# Patient Record
Sex: Female | Born: 1960 | Race: White | Hispanic: Yes | Smoking: Current every day smoker
Health system: Southern US, Community
[De-identification: ages and names within clinical notes are randomized; demographics above are authoritative.]

## PROBLEM LIST (undated history)

## (undated) DIAGNOSIS — I10 Essential (primary) hypertension: Secondary | ICD-10-CM

## (undated) DIAGNOSIS — E119 Type 2 diabetes mellitus without complications: Secondary | ICD-10-CM

## (undated) DIAGNOSIS — R221 Localized swelling, mass and lump, neck: Secondary | ICD-10-CM

---

## 2016-01-20 DIAGNOSIS — R221 Localized swelling, mass and lump, neck: Secondary | ICD-10-CM

## 2016-01-20 HISTORY — DX: Localized swelling, mass and lump, neck: R22.1

## 2016-02-10 ENCOUNTER — Emergency Department (HOSPITAL_COMMUNITY): Payer: Self-pay

## 2016-02-10 ENCOUNTER — Inpatient Hospital Stay (HOSPITAL_COMMUNITY)
Admission: EM | Admit: 2016-02-10 | Discharge: 2016-02-16 | DRG: 158 | Disposition: A | Discharge status: Home / Self Care | Payer: Self-pay | Attending: Internal Medicine | Admitting: Internal Medicine

## 2016-02-10 ENCOUNTER — Encounter (HOSPITAL_COMMUNITY): Payer: Self-pay

## 2016-02-10 DIAGNOSIS — Z6831 Body mass index (BMI) 31.0-31.9, adult: Secondary | ICD-10-CM

## 2016-02-10 DIAGNOSIS — F17210 Nicotine dependence, cigarettes, uncomplicated: Secondary | ICD-10-CM | POA: Diagnosis present

## 2016-02-10 DIAGNOSIS — K122 Cellulitis and abscess of mouth: Principal | ICD-10-CM | POA: Diagnosis present

## 2016-02-10 DIAGNOSIS — R221 Localized swelling, mass and lump, neck: Secondary | ICD-10-CM | POA: Diagnosis present

## 2016-02-10 DIAGNOSIS — R938 Abnormal findings on diagnostic imaging of other specified body structures: Secondary | ICD-10-CM

## 2016-02-10 DIAGNOSIS — R9389 Abnormal findings on diagnostic imaging of other specified body structures: Secondary | ICD-10-CM

## 2016-02-10 DIAGNOSIS — E669 Obesity, unspecified: Secondary | ICD-10-CM | POA: Diagnosis present

## 2016-02-10 DIAGNOSIS — E119 Type 2 diabetes mellitus without complications: Secondary | ICD-10-CM

## 2016-02-10 DIAGNOSIS — N179 Acute kidney failure, unspecified: Secondary | ICD-10-CM | POA: Diagnosis present

## 2016-02-10 DIAGNOSIS — I76 Septic arterial embolism: Secondary | ICD-10-CM | POA: Diagnosis present

## 2016-02-10 DIAGNOSIS — I10 Essential (primary) hypertension: Secondary | ICD-10-CM | POA: Diagnosis present

## 2016-02-10 DIAGNOSIS — D72829 Elevated white blood cell count, unspecified: Secondary | ICD-10-CM

## 2016-02-10 DIAGNOSIS — E1165 Type 2 diabetes mellitus with hyperglycemia: Secondary | ICD-10-CM | POA: Diagnosis present

## 2016-02-10 HISTORY — DX: Type 2 diabetes mellitus without complications: E11.9

## 2016-02-10 HISTORY — DX: Localized swelling, mass and lump, neck: R22.1

## 2016-02-10 HISTORY — DX: Essential (primary) hypertension: I10

## 2016-02-10 LAB — COMPREHENSIVE METABOLIC PANEL
ALBUMIN: 3.2 g/dL — AB (ref 3.5–5.0)
ALT: 22 U/L (ref 14–54)
AST: 25 U/L (ref 15–41)
Alkaline Phosphatase: 123 U/L (ref 38–126)
Anion gap: 11 (ref 5–15)
BILIRUBIN TOTAL: 0.7 mg/dL (ref 0.3–1.2)
BUN: 9 mg/dL (ref 6–20)
CHLORIDE: 99 mmol/L — AB (ref 101–111)
CO2: 25 mmol/L (ref 22–32)
Calcium: 9.9 mg/dL (ref 8.9–10.3)
Creatinine, Ser: 0.8 mg/dL (ref 0.44–1.00)
GFR calc Af Amer: >60 mL/min (ref >60)
GFR calc non Af Amer: >60 mL/min (ref >60)
GLUCOSE: 314 mg/dL — AB (ref 65–99)
Potassium: 3.7 mmol/L (ref 3.5–5.1)
Sodium: 135 mmol/L (ref 135–145)
Total Protein: 7.7 g/dL (ref 6.5–8.1)

## 2016-02-10 LAB — CBC WITH DIFFERENTIAL/PLATELET
Basophils Absolute: 0 10*3/uL (ref 0.0–0.1)
Basophils Relative: 0 %
EOS PCT: 2 %
Eosinophils Absolute: 0.3 10*3/uL (ref 0.0–0.7)
HCT: 43.5 % (ref 36.0–46.0)
Hemoglobin: 14.8 g/dL (ref 12.0–15.0)
LYMPHS ABS: 2.5 10*3/uL (ref 0.7–4.0)
LYMPHS PCT: 18 %
MCH: 29.5 pg (ref 26.0–34.0)
MCHC: 34 g/dL (ref 30.0–36.0)
MCV: 86.7 fL (ref 78.0–100.0)
MONO ABS: 0.7 10*3/uL (ref 0.1–1.0)
MONOS PCT: 5 %
Neutro Abs: 10.2 10*3/uL — ABNORMAL HIGH (ref 1.7–7.7)
Neutrophils Relative %: 75 %
PLATELETS: 287 10*3/uL (ref 150–400)
RBC: 5.02 MIL/uL (ref 3.87–5.11)
RDW: 12.5 % (ref 11.5–15.5)
WBC: 13.7 10*3/uL — ABNORMAL HIGH (ref 4.0–10.5)

## 2016-02-10 LAB — CBG MONITORING, ED: Glucose-Capillary: 263 mg/dL — ABNORMAL HIGH (ref 65–99)

## 2016-02-10 LAB — I-STAT CG4 LACTIC ACID, ED: LACTIC ACID, VENOUS: 1.39 mmol/L (ref 0.5–1.9)

## 2016-02-10 MED ORDER — ACETAMINOPHEN 325 MG PO TABS
650.0000 mg | ORAL_TABLET | Freq: Four times a day (QID) | ORAL | Status: DC | PRN
  Administered 2016-02-11: 650 mg via ORAL
  Filled 2016-02-10: qty 2

## 2016-02-10 MED ORDER — ONDANSETRON HCL 4 MG/2ML IJ SOLN: 4.0000 mg | Freq: Four times a day (QID) | INTRAMUSCULAR | Status: DC | PRN

## 2016-02-10 MED ORDER — INSULIN ASPART 100 UNIT/ML ~~LOC~~ SOLN
0.0000 [IU] | Freq: Three times a day (TID) | SUBCUTANEOUS | Status: DC
  Administered 2016-02-12 (×2): 4 [IU] via SUBCUTANEOUS
  Administered 2016-02-13: 7 [IU] via SUBCUTANEOUS
  Administered 2016-02-13 – 2016-02-14 (×3): 4 [IU] via SUBCUTANEOUS
  Administered 2016-02-14: 3 [IU] via SUBCUTANEOUS
  Administered 2016-02-14 – 2016-02-15 (×2): 4 [IU] via SUBCUTANEOUS
  Administered 2016-02-15: 7 [IU] via SUBCUTANEOUS
  Administered 2016-02-15: 4 [IU] via SUBCUTANEOUS
  Administered 2016-02-16: 7 [IU] via SUBCUTANEOUS
  Administered 2016-02-16: 4 [IU] via SUBCUTANEOUS

## 2016-02-10 MED ORDER — CLINDAMYCIN PHOSPHATE 900 MG/50ML IV SOLN
900.0000 mg | Freq: Once | INTRAVENOUS | Status: AC
  Administered 2016-02-10: 900 mg via INTRAVENOUS
  Filled 2016-02-10: qty 50

## 2016-02-10 MED ORDER — PIPERACILLIN-TAZOBACTAM 3.375 G IVPB 30 MIN
3.3750 g | Freq: Once | INTRAVENOUS | Status: AC
  Administered 2016-02-10: 3.375 g via INTRAVENOUS
  Filled 2016-02-10: qty 50

## 2016-02-10 MED ORDER — INSULIN ASPART 100 UNIT/ML ~~LOC~~ SOLN
0.0000 [IU] | Freq: Every day | SUBCUTANEOUS | Status: DC
  Administered 2016-02-11: 3 [IU] via SUBCUTANEOUS
  Filled 2016-02-10: qty 1

## 2016-02-10 MED ORDER — PIPERACILLIN-TAZOBACTAM 3.375 G IVPB
3.3750 g | Freq: Three times a day (TID) | INTRAVENOUS | Status: DC
  Administered 2016-02-11 – 2016-02-13 (×7): 3.375 g via INTRAVENOUS
  Filled 2016-02-10 (×8): qty 50

## 2016-02-10 MED ORDER — IOPAMIDOL (ISOVUE-300) INJECTION 61%
INTRAVENOUS | Status: AC
  Administered 2016-02-10: 75 mL
  Filled 2016-02-10: qty 75

## 2016-02-10 MED ORDER — ACETAMINOPHEN 650 MG RE SUPP: 650.0000 mg | Freq: Four times a day (QID) | RECTAL | Status: DC | PRN

## 2016-02-10 MED ORDER — SODIUM CHLORIDE 0.9 % IV SOLN
INTRAVENOUS | Status: DC
  Administered 2016-02-10 – 2016-02-15 (×4): via INTRAVENOUS

## 2016-02-10 MED ORDER — ONDANSETRON HCL 4 MG PO TABS: 4.0000 mg | ORAL_TABLET | Freq: Four times a day (QID) | ORAL | Status: DC | PRN

## 2016-02-10 MED ORDER — HYDROMORPHONE HCL 2 MG/ML IJ SOLN
1.0000 mg | Freq: Once | INTRAMUSCULAR | Status: AC
  Administered 2016-02-10: 1 mg via INTRAVENOUS
  Filled 2016-02-10: qty 1

## 2016-02-10 NOTE — ED Provider Notes (Signed)
[facility]-EMER[facility]ENCY DEPT Provider Note   CSN: [id number] Arrival date & time: 02/10/16  1047     History   Chief Complaint Chief Complaint  Patient presents with  . Abscess    HPI Hannah Gray is a 56 y.o. female.  HPI 56 year old female with a history of hypertension and diabetes who states that she does not take any medicines presenting with swelling and erythema over the left cheek and lower jaw. She denies any recent fevers. She states that this has been slowly growing for 5 days. She has never had this before. She has been able to tolerate her secretions and able to swallow liquids and solids. She states that she has had mild pain on swallowing since yesterday. She denies any shortness of breath or difficulty breathing. She endorses dull pain over the swollen area but denies headache, neck pain, chest pain, abdominal pain, nausea, vomiting, diarrhea. She recently traveled here from [place] and was taken an over-the-counter [place] antibiotic since this began with no relief. Denies any tongue swelling or difficulty speaking. She moved to [place] from [place] on Dec. 26, 2017.  Past Medical History:  Diagnosis Date  . Diabetes mellitus without complication (HCC)   . Hypertension     Patient Active Problem List   Diagnosis Date Noted  . Neck mass 02/10/2016  . HTN (hypertension) 02/10/2016  . Diabetes (HCC) 02/10/2016  . Abnormal CT scan, neck 02/10/2016  . Leukocytosis 02/10/2016    History reviewed. No pertinent surgical history.  OB History    No data available       Home Medications    Prior to Admission medications   Medication Sig Start Date End Date Taking? Authorizing Provider  OVER THE COUNTER MEDICATION Take 1 tablet by mouth daily.   Yes Historical Provider, MD  OVER THE COUNTER MEDICATION Take 1 tablet by mouth daily.   Yes Historical Provider, MD    Family History Family History  Problem Relation Age of Onset  . Leukemia Neg Hx   . Lymphoma Neg  Hx   . Head & neck cancer Neg Hx   . Diabetes Neg Hx   . Hypertension Neg Hx     Social History Social History  Substance Use Topics  . Smoking status: Current Every Day Smoker    Packs/day: 0.25  . Smokeless tobacco: Never Used  . Alcohol use No     Allergies   Patient has no known allergies.   Review of Systems Review of Systems  Constitutional: Negative for chills and fever.  HENT: Positive for facial swelling and sore throat. Negative for ear pain, rhinorrhea, sinus pain and trouble swallowing.   Eyes: Negative.   Respiratory: Negative for cough, choking, shortness of breath, wheezing and stridor.   Cardiovascular: Negative for chest pain and palpitations.  Gastrointestinal: Negative for abdominal pain, constipation, diarrhea, nausea and vomiting.  Genitourinary: Negative.   Musculoskeletal: Negative for back pain, neck pain and neck stiffness.  Skin: Positive for rash (and swelling to L face).  Neurological: Negative for dizziness, syncope, facial asymmetry, weakness, light-headedness and numbness.  All other systems reviewed and are negative.    Physical Exam Updated Vital Signs BP 100/67   Pulse 74   Temp 98.3 F (36.8 C) (Oral)   Resp 17   SpO2 99%   Physical Exam  Constitutional: She appears well-developed and well-nourished. No distress.  Obese female  HENT:  Head: Normocephalic and atraumatic.    Significant, approx 8cm x 10cm area of erythema  Eyes: Conjunctivae are normal.  Neck: Neck supple.  Cardiovascular: Normal rate and regular rhythm.   No murmur heard. Pulmonary/Chest: Effort normal and breath sounds normal. No respiratory distress.  Abdominal: Soft. There is no tenderness.  Musculoskeletal: She exhibits no edema.  Neurological: She is alert.  Skin: Skin is warm and dry. She is not diaphoretic.  Psychiatric: She has a normal mood and affect.  Nursing note and vitals reviewed.    ED Treatments / Results  Labs (all labs ordered  are listed, but only abnormal results are displayed) Labs Reviewed  COMPREHENSIVE METABOLIC PANEL - Abnormal; Notable for the following:       Result Value   Chloride 99 (*)    Glucose, Bld 314 (*)    Albumin 3.2 (*)    All other components within normal limits  CBC WITH DIFFERENTIAL/PLATELET - Abnormal; Notable for the following:    WBC 13.7 (*)    Neutro Abs 10.2 (*)    All other components within normal limits  CULTURE, BLOOD (ROUTINE X 2)  CULTURE, BLOOD (ROUTINE X 2)  I-STAT CG4 LACTIC ACID, ED  I-STAT CG4 LACTIC ACID, ED    EKG  EKG Interpretation None       Radiology Ct Soft Tissue Neck W Contrast  Result Date: 02/10/2016 CLINICAL DATA:  LEFT facial swelling for 5 days. Evaluate neck abscess. History of diabetes, hypertension. EXAM: CT NECK WITH CONTRAST TECHNIQUE: Multidetector CT imaging of the neck was performed using the standard protocol following the bolus administration of intravenous contrast. CONTRAST:  75mL ISOVUE-300 IOPAMIDOL (ISOVUE-300) INJECTION 61% COMPARISON:  None. FINDINGS: Pharynx and larynx: Trace retropharyngeal effusion, pharynx and larynx are otherwise unremarkable. Salivary glands: Multiloculated 6.2 x 4.6 x 3.4 cm LEFT submandibular irregular cystic peripherally enhancing mass, discontinuous along the lateral margin most consistent with rupture. Hyperemic LEFT submandibular parenchyma without sialolith or definite ductal dilatation. Contiguous extension into the LEFT masseter muscle. Fat stranding anterior aspect LEFT superficial parotid lobe, otherwise unremarkable. Thyroid: Normal. Lymph nodes: Greater than expected number of non pathologically enlarged and may be reactive however, rounded morphology LEFT level 5 B lymph node. Vascular: Trace calcific atherosclerosis of the aortic arch. Limited intracranial: Normal. Visualized orbits: Normal. Mastoids and visualized paranasal sinuses: LEFT maxillary mucosal retention cyst. No paranasal sinus air-fluid  levels. Mastoid air cells are well aerated. Skeleton: Mild degenerative changes cervical spine. No acute osseous process. Poor dentition with multiple periapical abscess. Upper chest: Consolidation with central air RIGHT posterior upper lobe, periphery of LEFT upper lobe. No superior mediastinal lymphadenopathy. Other: Small LEFT neck effusion. Extensive LEFT neck subcutaneous fat stranding without subcutaneous gas or radiopaque foreign bodies. IMPRESSION: 6.2 x 4.6 x 3.4 cm ruptured LEFT submandibular abscess with LEFT masseter muscle extension. Greater than expected number of non pathologically lymph nodes. The findings may represent simple infection, underlying mass is possible and follow-up is recommended. Dense consolidations and upper lobes concerning for pneumonia, neoplasm not excluded. Recommend CT chest with contrast. Electronically Signed   By: Awilda Metro M.D.   On: 02/10/2016 18:03   Dg Chest Portable 1 View  Result Date: 02/10/2016 CLINICAL DATA:  Known cheek abscess EXAM: PORTABLE CHEST 1 VIEW COMPARISON:  None. FINDINGS: Cardiac shadow is at the upper limits of normal in size but accentuated by the portable technique. The lungs are well aerated bilaterally. No focal infiltrate or sizable effusion is seen. No acute bony abnormality is noted. IMPRESSION: No acute abnormality seen. Electronically Signed   By: Eulah Pont.D.  On: 02/10/2016 20:34    Procedures Procedures (including critical care time)  Medications Ordered in ED Medications  clindamycin (CLEOCIN) IVPB 900 mg (0 mg Intravenous Stopped 02/10/16 1728)  iopamidol (ISOVUE-300) 61 % injection (75 mLs  Contrast Given 02/10/16 1727)  HYDROmorphone (DILAUDID) injection 1 mg (1 mg Intravenous Given 02/10/16 1947)  piperacillin-tazobactam (ZOSYN) IVPB 3.375 g (0 g Intravenous Stopped 02/10/16 2132)     Initial Impression / Assessment and Plan / ED Course  I have reviewed the triage vital signs and the nursing  notes.  Pertinent labs & imaging results that were available during my care of the patient were reviewed by me and considered in my medical decision making (see chart for details).    56 year old female with a history of diabetes presenting with 5 days of worsening swelling and erythema over the left jaw. She recently moved to [place] from [place] last month. Exam notable for extensive swelling of the left mandible with overlying erythema. She has full extension and flexion of the neck with no pain. She is able to turn her head to the right with no pain however has slight pain when turning her head to the left due to the amount of swelling. No swelling within the oropharynx, soft palate, sublingual area. Doubt Ludwig's. No signs of respiratory distress. Patient tolerating secretions. 100% on room air. Low suspicion for RPA but high concern for large abscess versus mass. CT neck with contrast soft tissue ordered.  Labs notable for a leukocytosis of 13.7 and hyperglycemia of 314. Blood cultures drawn and patient given clindamycin.  CT concerning for a ruptured left submandibular abscess with left masseter muscle extension as well as nonpathological lymph nodes concerned for underlying mass. Also noted dense consolidations in the upper lobes concerning for pneumonia or neoplasm. As the patient recently moved from [place] she is high risk for TB. Chest x-ray ordered to rule out cavitary lesions. She was given Zosyn to broaden her coverage for potential pneumonia.  ENT consulted and does not believe that she requires any acute intervention at this time. They've recommended medicine admission and they will see her in the morning.  Chest x-ray unremarkable with no signs of cavitary lesions. Pt [facility] with Dr. Montez Morita for further evaluation of potential neck mass.  Patient care discussed and supervised by my attending, Dr. Silverio Lay. Azalia Bilis, MD   Final Clinical Impressions(s) / ED  Diagnoses   Final diagnoses:  None    New Prescriptions New Prescriptions   No medications on file     Raechal Raben [place] Rmani Kellogg, MD 02/11/16 1610    Charlynne Pander, MD 02/11/16 1051

## 2016-02-10 NOTE — ED Triage Notes (Addendum)
Pt has very large abscess to the left side of her cheek. Redness and severe swelling noted.   Triage completed using pacific interpreters.  Pt states the abscess has been there X5 days. She reports it began small and has continued to grow with pain. Oral secretions are controlled however she does report it is painful to swallow.

## 2016-02-10 NOTE — H&P (Signed)
History and Physical    Hannah Gray:[id number] DOB: 01-01-1961 DOA: 02/10/2016  PCP: Patient is from [place]; she is visiting one of her son's here.  Patient coming from: Her son's home.  Chief Complaint: Left jaw swelling.  HPI: Hannah Gray is a 56 y.o. woman with a history of HTN and DM who is visiting her son, from [place].  She speaks Spanish, and this history and physical was completed with video interpreter assistance [phone]).  The patient reports that she has had five days of progressive swelling just below her left jaw.  She denies any bites or new exposures.  She denies any tooth pain or known history of tooth abscesses.  No fever, no shortness of breath.  She has difficulty opening mouth due to the swelling, so she has been on a liquid diet.  She denies signs or symptoms of aspiration.  She reports night sweats and a weight loss of 20 kilos in the past three months.  No headache, no cough, no hempotysis.  ED Course: CT of her neck with contrast shows a multiloculated submandibular cystic mass on the left, with extension into her masseter muscle, thought to be consistent with a ruptured abscess.  LAD, "greater than expect", is present.  She also has incidental findings of bilateral upper lobe consolidations in her lungs; neoplasm cannot be excluded.  She initially received a single dose of IV clindamycin.  After her imaging report came back, her coverage was broadened to IV Zosyn.  WBC count 13.7.  Normal lactic acid level.  ENT has been consulted.  Hospitalist asked to admit.  Review of Systems: As per HPI otherwise 10 point review of systems negative.    Past Medical History:  Diagnosis Date  . Diabetes mellitus without complication (HCC)   . Hypertension     History reviewed. No pertinent surgical history.   reports that she has been smoking.  She has been smoking about 0.25 packs per day. She has never used smokeless tobacco. She reports that she does  not drink alcohol or use drugs.  She is a widow.  She has seven children.  She is visiting a son who lives in [place].  She has been here since December.  No Known Allergies  Family History  Problem Relation Age of Onset  . Leukemia Neg Hx   . Lymphoma Neg Hx   . Head & neck cancer Neg Hx   . Diabetes Neg Hx   . Hypertension Neg Hx      Prior to Admission medications   Medication Sig Start Date End Date Taking? Authorizing Provider  OVER THE COUNTER MEDICATION Take 1 tablet by mouth daily.   Yes Historical Provider, MD  OVER THE COUNTER MEDICATION Take 1 tablet by mouth daily.   Yes Historical Provider, MD    Physical Exam: Vitals:   02/10/16 2030 02/10/16 2045 02/10/16 2100 02/10/16 2115  BP: 107/77 105/67 106/75 101/68  Pulse: 86 83 80 78  Resp:      Temp:      TempSrc:      SpO2: 99% 98% 99% 99%      Constitutional: NAD, calm, comfortable Vitals:   02/10/16 2030 02/10/16 2045 02/10/16 2100 02/10/16 2115  BP: 107/77 105/67 106/75 101/68  Pulse: 86 83 80 78  Resp:      Temp:      TempSrc:      SpO2: 99% 98% 99% 99%   Eyes: PERRL, lids and conjunctivae normal ENMT: Mucous membranes are  moist. Posterior pharynx cannot be visualized completely.  Mouth opening restriction due to the amount of swelling present.  No apparent lesions to the inside of her left cheek/jaw.  Normal dentition.  Neck: She has a large submandibular mass on the left with erythema but no tenderness.  It is firm.  No fluctuance.  No warmth. Respiratory: clear to auscultation bilaterally, no wheezing, no crackles. Normal respiratory effort. No accessory muscle use.  Cardiovascular: Normal rate, regular rhythm, no murmurs / rubs / gallops. No extremity edema. 2+ pedal pulses. GI: abdomen is soft and compressible.  No distention.  No tenderness.  Bowel sounds are present. Musculoskeletal:  No joint deformity in upper and lower extremities. Good ROM, no contractures. Normal muscle tone.  Skin: no  rashes, warm and dry Neurologic: CN 2-12 grossly intact. Sensation intact, Strength symmetric bilaterally, 5/5.  Psychiatric: Normal judgment and insight. Alert and oriented x 3. Normal mood.     Labs on Admission: I have personally reviewed following labs and imaging studies  CBC:  Recent Labs Lab 02/10/16 1141  WBC 13.7*  NEUTROABS 10.2*  HGB 14.8  HCT 43.5  MCV 86.7  PLT 287   Basic Metabolic Panel:  Recent Labs Lab 02/10/16 1141  NA 135  K 3.7  CL 99*  CO2 25  GLUCOSE 314*  BUN 9  CREATININE 0.80  CALCIUM 9.9   GFR: CrCl cannot be calculated (Unknown ideal weight.). Liver Function Tests:  Recent Labs Lab 02/10/16 1141  AST 25  ALT 22  ALKPHOS 123  BILITOT 0.7  PROT 7.7  ALBUMIN 3.2*   Sepsis Labs:  Lactic acid level 1.39  Radiological Exams on Admission: Ct Soft Tissue Neck W Contrast  Result Date: 02/10/2016 CLINICAL DATA:  LEFT facial swelling for 5 days. Evaluate neck abscess. History of diabetes, hypertension. EXAM: CT NECK WITH CONTRAST TECHNIQUE: Multidetector CT imaging of the neck was performed using the standard protocol following the bolus administration of intravenous contrast. CONTRAST:  75mL ISOVUE-300 IOPAMIDOL (ISOVUE-300) INJECTION 61% COMPARISON:  None. FINDINGS: Pharynx and larynx: Trace retropharyngeal effusion, pharynx and larynx are otherwise unremarkable. Salivary glands: Multiloculated 6.2 x 4.6 x 3.4 cm LEFT submandibular irregular cystic peripherally enhancing mass, discontinuous along the lateral margin most consistent with rupture. Hyperemic LEFT submandibular parenchyma without sialolith or definite ductal dilatation. Contiguous extension into the LEFT masseter muscle. Fat stranding anterior aspect LEFT superficial parotid lobe, otherwise unremarkable. Thyroid: Normal. Lymph nodes: Greater than expected number of non pathologically enlarged and may be reactive however, rounded morphology LEFT level 5 B lymph node. Vascular: Trace  calcific atherosclerosis of the aortic arch. Limited intracranial: Normal. Visualized orbits: Normal. Mastoids and visualized paranasal sinuses: LEFT maxillary mucosal retention cyst. No paranasal sinus air-fluid levels. Mastoid air cells are well aerated. Skeleton: Mild degenerative changes cervical spine. No acute osseous process. Poor dentition with multiple periapical abscess. Upper chest: Consolidation with central air RIGHT posterior upper lobe, periphery of LEFT upper lobe. No superior mediastinal lymphadenopathy. Other: Small LEFT neck effusion. Extensive LEFT neck subcutaneous fat stranding without subcutaneous gas or radiopaque foreign bodies. IMPRESSION: 6.2 x 4.6 x 3.4 cm ruptured LEFT submandibular abscess with LEFT masseter muscle extension. Greater than expected number of non pathologically lymph nodes. The findings may represent simple infection, underlying mass is possible and follow-up is recommended. Dense consolidations and upper lobes concerning for pneumonia, neoplasm not excluded. Recommend CT chest with contrast. Electronically Signed   By: Awilda Metroourtnay  Bloomer M.D.   On: 02/10/2016 18:03   Dg Chest  Portable 1 View  Result Date: 02/10/2016 CLINICAL DATA:  Known cheek abscess EXAM: PORTABLE CHEST 1 VIEW COMPARISON:  None. FINDINGS: Cardiac shadow is at the upper limits of normal in size but accentuated by the portable technique. The lungs are well aerated bilaterally. No focal infiltrate or sizable effusion is seen. No acute bony abnormality is noted. IMPRESSION: No acute abnormality seen. Electronically Signed   By: Alcide Clever M.D.   On: 02/10/2016 20:34   Assessment/Plan Principal Problem:   Neck mass Active Problems:   HTN (hypertension)   Diabetes (HCC)   Abnormal CT scan, neck   Leukocytosis      Complicated left submandibular mass (ruptured abscess favored but underlying malignancy is also a consideration) --ENT consult pending --Continue IV zosyn for now --Blood  cultures pending --Clear liquid diet as to tolerated for now --Analgesics as needed  Incidental lung findings on CT neck --Hydrate with NS overnight --She will need CT of her chest with contrast in 24 hours  HTN --Patient cannot name any medications that she was taking in [place].  Monitor for now.  Diabetes --Sliding scale insulin coverage AC/HS for now.     DVT prophylaxis: SCDs.  Encourage ambulation. Code Status: FULL Family Communication: Patient alone in the ED at time of admission. Disposition Plan: Expect she will discharge to her son's home when ready. Consults called: ENT Admission status: Inpatient, med surg.  I expect this patient will need inpatient services for greater than two midnights.   TIME SPENT: 70 minutes, primarily due to language barrier and need for interpreter services.   Jerene Bears MD [facility] Pager [phone]  If 7PM-7AM, please contact night-coverage [email or web address] Password [facility]  02/10/2016, 9:55 PM

## 2016-02-10 NOTE — ED Notes (Signed)
XR at bedside

## 2016-02-10 NOTE — ED Notes (Addendum)
Pt desat to 84-85% RA when attempting to sleep. Pt repositioned and placed on O2 via [place] at 2l. Dr. Silverio LayYao aware.

## 2016-02-11 ENCOUNTER — Encounter (HOSPITAL_COMMUNITY): Payer: Self-pay | Admitting: General Practice

## 2016-02-11 ENCOUNTER — Inpatient Hospital Stay (HOSPITAL_COMMUNITY): Payer: Self-pay

## 2016-02-11 DIAGNOSIS — F17210 Nicotine dependence, cigarettes, uncomplicated: Secondary | ICD-10-CM

## 2016-02-11 DIAGNOSIS — Z9889 Other specified postprocedural states: Secondary | ICD-10-CM

## 2016-02-11 DIAGNOSIS — E119 Type 2 diabetes mellitus without complications: Secondary | ICD-10-CM

## 2016-02-11 DIAGNOSIS — K122 Cellulitis and abscess of mouth: Principal | ICD-10-CM

## 2016-02-11 DIAGNOSIS — Z794 Long term (current) use of insulin: Secondary | ICD-10-CM

## 2016-02-11 HISTORY — PX: INCISION AND DRAINAGE ABSCESS: SHX5864

## 2016-02-11 LAB — BASIC METABOLIC PANEL
Anion gap: 6 (ref 5–15)
BUN: 9 mg/dL (ref 6–20)
CALCIUM: 8.8 mg/dL — AB (ref 8.9–10.3)
CO2: 24 mmol/L (ref 22–32)
Chloride: 103 mmol/L (ref 101–111)
Creatinine, Ser: 0.58 mg/dL (ref 0.44–1.00)
Glucose, Bld: 215 mg/dL — ABNORMAL HIGH (ref 65–99)
Potassium: 3.3 mmol/L — ABNORMAL LOW (ref 3.5–5.1)
SODIUM: 133 mmol/L — AB (ref 135–145)

## 2016-02-11 LAB — GLUCOSE, CAPILLARY
GLUCOSE-CAPILLARY: 159 mg/dL — AB (ref 65–99)
Glucose-Capillary: 221 mg/dL — ABNORMAL HIGH (ref 65–99)

## 2016-02-11 LAB — PROTIME-INR
INR: 1.23
PROTHROMBIN TIME: 15.6 s — AB (ref 11.4–15.2)

## 2016-02-11 LAB — CBC
HCT: 39.4 % (ref 36.0–46.0)
Hemoglobin: 13.2 g/dL (ref 12.0–15.0)
MCH: 28.9 pg (ref 26.0–34.0)
MCHC: 33.5 g/dL (ref 30.0–36.0)
MCV: 86.4 fL (ref 78.0–100.0)
Platelets: 250 10*3/uL (ref 150–400)
RBC: 4.56 MIL/uL (ref 3.87–5.11)
RDW: 12.5 % (ref 11.5–15.5)
WBC: 10.7 10*3/uL — AB (ref 4.0–10.5)

## 2016-02-11 LAB — CBG MONITORING, ED
Glucose-Capillary: 262 mg/dL — ABNORMAL HIGH (ref 65–99)
Glucose-Capillary: 209 mg/dL — ABNORMAL HIGH (ref 65–99)

## 2016-02-11 LAB — APTT: aPTT: 35 seconds (ref 24–36)

## 2016-02-11 MED ORDER — VANCOMYCIN HCL IN DEXTROSE 750-5 MG/150ML-% IV SOLN
750.0000 mg | Freq: Two times a day (BID) | INTRAVENOUS | Status: DC
  Administered 2016-02-12 – 2016-02-13 (×3): 750 mg via INTRAVENOUS
  Filled 2016-02-11 (×3): qty 150

## 2016-02-11 MED ORDER — BOOST / RESOURCE BREEZE PO LIQD: 1.0000 | Freq: Three times a day (TID) | ORAL | Status: DC

## 2016-02-11 MED ORDER — VANCOMYCIN HCL IN DEXTROSE 1-5 GM/200ML-% IV SOLN
1000.0000 mg | Freq: Once | INTRAVENOUS | Status: AC
  Administered 2016-02-11: 1000 mg via INTRAVENOUS
  Filled 2016-02-11: qty 200

## 2016-02-11 MED ORDER — IOPAMIDOL (ISOVUE-300) INJECTION 61%
INTRAVENOUS | Status: AC
  Administered 2016-02-11: 75 mL
  Filled 2016-02-11: qty 75

## 2016-02-11 NOTE — H&P (Signed)
HPI:  Hannah Gray is an 56 y.o. female who presented to the [facility] ER last night c/o swelling and erythema over the left cheek and lower jaw. She recently moved from [place] on Dec. 26, 2017. She denies any recent fevers. She states that her left facial swelling has been slowly growing for 5 days. She has never had this before. She has been able to tolerate her secretions and able to swallow liquids and solids. She states that she has had mild pain on swallowing since the day before. She denies any shortness of breath or difficulty breathing. She reports dull pain over the swollen area but denies headache, neck pain, chest pain, abdominal pain, nausea, vomiting, diarrhea. She recently traveled here from [place] and has taken an over-the-counter [place] antibiotic since this began with no relief. Denies any tongue swelling or difficulty speaking.   Past Medical History:  Diagnosis Date  . Diabetes mellitus without complication ([place])   . Hypertension     History reviewed. No pertinent surgical history.  Family History  Problem Relation Age of Onset  . Leukemia Neg Hx   . Lymphoma Neg Hx   . Head & neck cancer Neg Hx   . Diabetes Neg Hx   . Hypertension Neg Hx     Social History:  reports that she has been smoking.  She has been smoking about 0.25 packs per day. She has never used smokeless tobacco. She reports that she does not drink alcohol or use drugs.  Allergies: No Known Allergies  Prior to Admission medications   Medication Sig Start Date End Date Taking? Authorizing Provider  OVER THE COUNTER MEDICATION Take 1 tablet by mouth daily.   Yes Historical Provider, MD  OVER THE COUNTER MEDICATION Take 1 tablet by mouth daily.   Yes Historical Provider, MD    Medications:  I have reviewed the patient's current medications. Scheduled: . insulin aspart  0-20 Units Subcutaneous TID WC  . insulin aspart  0-5 Units Subcutaneous QHS   SLH:[id number] **OR** acetaminophen,  ondansetron **OR** ondansetron (ZOFRAN) IV  Results for orders placed or performed during the hospital encounter of 02/10/16 (from the past 48 hour(s))  Comprehensive metabolic panel     Status: Abnormal   Collection Time: 02/10/16 11:41 AM  Result Value Ref Range   Sodium 135 135 - 145 mmol/L   Potassium 3.7 3.5 - 5.1 mmol/L   Chloride 99 (L) 101 - 111 mmol/L   CO2 25 22 - 32 mmol/L   Glucose, Bld 314 (H) 65 - 99 mg/dL   BUN 9 6 - 20 mg/dL   Creatinine, Ser 0.80 0.44 - 1.00 mg/dL   Calcium 9.9 8.9 - 10.3 mg/dL   Total Protein 7.7 6.5 - 8.1 g/dL   Albumin 3.2 (L) 3.5 - 5.0 g/dL   AST 25 15 - 41 U/L   ALT 22 14 - 54 U/L   Alkaline Phosphatase 123 38 - 126 U/L   Total Bilirubin 0.7 0.3 - 1.2 mg/dL   GFR calc non Af Amer >60 >60 mL/min   GFR calc Af Amer >60 >60 mL/min    Comment: (NOTE) The eGFR has been calculated using the CKD EPI equation. This calculation has not been validated in all clinical situations. eGFR's persistently <60 mL/min signify possible Chronic Kidney Disease.    Anion gap 11 5 - 15  CBC with Differential     Status: Abnormal   Collection Time: 02/10/16 11:41 AM  Result Value Ref Range  WBC 13.7 (H) 4.0 - 10.5 K/uL   RBC 5.02 3.87 - 5.11 MIL/uL   Hemoglobin 14.8 12.0 - 15.0 g/dL   HCT 43.5 36.0 - 46.0 %   MCV 86.7 78.0 - 100.0 fL   MCH 29.5 26.0 - 34.0 pg   MCHC 34.0 30.0 - 36.0 g/dL   RDW 12.5 11.5 - 15.5 %   Platelets 287 150 - 400 K/uL   Neutrophils Relative % 75 %   Neutro Abs 10.2 (H) 1.7 - 7.7 K/uL   Lymphocytes Relative 18 %   Lymphs Abs 2.5 0.7 - 4.0 K/uL   Monocytes Relative 5 %   Monocytes Absolute 0.7 0.1 - 1.0 K/uL   Eosinophils Relative 2 %   Eosinophils Absolute 0.3 0.0 - 0.7 K/uL   Basophils Relative 0 %   Basophils Absolute 0.0 0.0 - 0.1 K/uL  I-Stat CG4 Lactic Acid, ED     Status: None   Collection Time: 02/10/16 12:04 PM  Result Value Ref Range   Lactic Acid, Venous 1.39 0.5 - 1.9 mmol/L  Blood culture (routine x 2)      Status: None (Preliminary result)   Collection Time: 02/10/16  7:53 PM  Result Value Ref Range   Specimen Description BLOOD RIGHT HAND    Special Requests BOTTLES DRAWN AEROBIC AND ANAEROBIC 3CC    Culture PENDING    Report Status PENDING   CBG monitoring, ED     Status: Abnormal   Collection Time: 02/10/16 11:36 PM  Result Value Ref Range   Glucose-Capillary 263 (H) 65 - 99 mg/dL  CBC     Status: Abnormal   Collection Time: 02/11/16  3:15 AM  Result Value Ref Range   WBC 10.7 (H) 4.0 - 10.5 K/uL   RBC 4.56 3.87 - 5.11 MIL/uL   Hemoglobin 13.2 12.0 - 15.0 g/dL   HCT 39.4 36.0 - 46.0 %   MCV 86.4 78.0 - 100.0 fL   MCH 28.9 26.0 - 34.0 pg   MCHC 33.5 30.0 - 36.0 g/dL   RDW 12.5 11.5 - 15.5 %   Platelets 250 150 - 400 K/uL  Basic metabolic panel     Status: Abnormal   Collection Time: 02/11/16  3:15 AM  Result Value Ref Range   Sodium 133 (L) 135 - 145 mmol/L   Potassium 3.3 (L) 3.5 - 5.1 mmol/L   Chloride 103 101 - 111 mmol/L   CO2 24 22 - 32 mmol/L   Glucose, Bld 215 (H) 65 - 99 mg/dL   BUN 9 6 - 20 mg/dL   Creatinine, Ser 0.58 0.44 - 1.00 mg/dL   Calcium 8.8 (L) 8.9 - 10.3 mg/dL   GFR calc non Af Amer >60 >60 mL/min   GFR calc Af Amer >60 >60 mL/min    Comment: (NOTE) The eGFR has been calculated using the CKD EPI equation. This calculation has not been validated in all clinical situations. eGFR's persistently <60 mL/min signify possible Chronic Kidney Disease.    Anion gap 6 5 - 15  Protime-INR     Status: Abnormal   Collection Time: 02/11/16  3:15 AM  Result Value Ref Range   Prothrombin Time 15.6 (H) 11.4 - 15.2 seconds   INR 1.23   APTT     Status: None   Collection Time: 02/11/16  3:15 AM  Result Value Ref Range   aPTT 35 24 - 36 seconds  Aerobic/Anaerobic Culture (surgical/deep wound)     Status: None (Preliminary result)  Collection Time: 02/11/16  8:40 AM  Result Value Ref Range   Specimen Description ABSCESS LEFT FACE    Special Requests NONE    Gram  Stain      MODERATE WBC PRESENT, PREDOMINANTLY PMN ABUNDANT GRAM POSITIVE COCCI IN PAIRS ABUNDANT GRAM NEGATIVE COCCOBACILLI FEW GRAM VARIABLE ROD    Culture PENDING    Report Status PENDING     Ct Soft Tissue Neck W Contrast  Result Date: 02/10/2016 CLINICAL DATA:  LEFT facial swelling for 5 days. Evaluate neck abscess. History of diabetes, hypertension. EXAM: CT NECK WITH CONTRAST TECHNIQUE: Multidetector CT imaging of the neck was performed using the standard protocol following the bolus administration of intravenous contrast. CONTRAST:  65m ISOVUE-300 IOPAMIDOL (ISOVUE-300) INJECTION 61% COMPARISON:  None. FINDINGS: Pharynx and larynx: Trace retropharyngeal effusion, pharynx and larynx are otherwise unremarkable. Salivary glands: Multiloculated 6.2 x 4.6 x 3.4 cm LEFT submandibular irregular cystic peripherally enhancing mass, discontinuous along the lateral margin most consistent with rupture. Hyperemic LEFT submandibular parenchyma without sialolith or definite ductal dilatation. Contiguous extension into the LEFT masseter muscle. Fat stranding anterior aspect LEFT superficial parotid lobe, otherwise unremarkable. Thyroid: Normal. Lymph nodes: Greater than expected number of non pathologically enlarged and may be reactive however, rounded morphology LEFT level 5 B lymph node. Vascular: Trace calcific atherosclerosis of the aortic arch. Limited intracranial: Normal. Visualized orbits: Normal. Mastoids and visualized paranasal sinuses: LEFT maxillary mucosal retention cyst. No paranasal sinus air-fluid levels. Mastoid air cells are well aerated. Skeleton: Mild degenerative changes cervical spine. No acute osseous process. Poor dentition with multiple periapical abscess. Upper chest: Consolidation with central air RIGHT posterior upper lobe, periphery of LEFT upper lobe. No superior mediastinal lymphadenopathy. Other: Small LEFT neck effusion. Extensive LEFT neck subcutaneous fat stranding without  subcutaneous gas or radiopaque foreign bodies. IMPRESSION: 6.2 x 4.6 x 3.4 cm ruptured LEFT submandibular abscess with LEFT masseter muscle extension. Greater than expected number of non pathologically lymph nodes. The findings may represent simple infection, underlying mass is possible and follow-up is recommended. Dense consolidations and upper lobes concerning for pneumonia, neoplasm not excluded. Recommend CT chest with contrast. Electronically Signed   By: CElon AlasM.D.   On: 02/10/2016 18:03   Dg Chest Portable 1 View  Result Date: 02/10/2016 CLINICAL DATA:  Known cheek abscess EXAM: PORTABLE CHEST 1 VIEW COMPARISON:  None. FINDINGS: Cardiac shadow is at the upper limits of normal in size but accentuated by the portable technique. The lungs are well aerated bilaterally. No focal infiltrate or sizable effusion is seen. No acute bony abnormality is noted. IMPRESSION: No acute abnormality seen. Electronically Signed   By: MInez CatalinaM.D.   On: 02/10/2016 20:34   Review of Systems  Constitutional: Negative for chills and fever.  HENT: Positive for facial swelling and sore throat. Negative for ear pain, rhinorrhea, sinus pain and trouble swallowing.   Eyes: Negative.   Respiratory: Negative for cough, choking, shortness of breath, wheezing and stridor.   Cardiovascular: Negative for chest pain and palpitations.  Gastrointestinal: Negative for abdominal pain, constipation, diarrhea, nausea and vomiting.  Genitourinary: Negative.   Musculoskeletal: Negative for back pain, neck pain and neck stiffness.  Skin: Positive for rash (and swelling to L face).  Neurological: Negative for dizziness, syncope, facial asymmetry, weakness, light-headedness and numbness.  All other systems reviewed and are negative.  Blood pressure 97/64, pulse 67, temperature 98.3 F (36.8 C), temperature source Oral, resp. rate 16, SpO2 97 %. Physical Exam  Constitutional: She appears  well-developed and  well-nourished. No distress.  Head: Normocephalic and atraumatic.  Face: Large approx 8cm x 10cm area of erythema and swelling over the left lower face. The area is soft to palpation. Ears: Normal auricles and EACs. Nose: Normal mucosa. Mouth: Normal mucosa. Floor of mouth soft to palpation. Eyes: Conjunctivae are normal. PERRL, EOMI. Cardiovascular: Normal rate and regular rhythm.   Pulmonary/Chest: Effort normal and breath sounds normal. No respiratory distress.  Abdominal: Soft. There is no tenderness.  Musculoskeletal: She exhibits no edema.  Neurological: She is alert.  Skin: Skin is warm and dry. She is not diaphoretic.  Psychiatric: She has a normal mood and affect.  Nursing note and vitals reviewed.  Procedure: Incision and drainage of the left facial/submandibular abscess.   Anesthesia: local anesthesia with 1% lidocaine with epinephrine.   Description: The patient is placed upright on the hospital bed.  After adequate local anesthesia is achieved, an 18-G needle is used to make multiple passes through the abscess cavity.  Approximately 12 cc of purulent fluid was evacuated.  The abscess cavity was incised opened, with evacuation of a large amount of purulent materials.  The patient tolerated the procedure well.    Assessment/Plan: Left facial/submandibular abscess. I&D performed in the ER without difficulty. Specimens sent for aerobic/anaerobic/fungal/mycobacteria cultures. Pt will be admitted for IV antibiotic treatment.  Tal Neer,SUI W 02/11/2016, 10:18 AM

## 2016-02-11 NOTE — Progress Notes (Signed)
PROGRESS NOTE    Hannah Gray  ZOX:[id number] DOB: Apr 25, 1960 DOA: 02/10/2016 PCP: No PCP Per Patient   Brief Narrative:  Patient admitted for submandibular abscess s/p I&D. Seen by ENT and now on IV Abx.    Assessment & Plan:   Principal Problem:   Neck mass Active Problems:   HTN (hypertension)   Diabetes (HCC)   Abnormal CT scan, neck   Leukocytosis  Complicated left submandibular mass s/p I&D 02/10/16 -ENT consulted in Ed, cont IV abx for now  -Cont Zosyn, will add vanc -Infectious Disease consulted today, will follow up recs -Blood cultures follow up  -Diet as tolerated  -Analgesics as needed  Incidental lung findings on CT neck --CT chest with contrast  HTN --Patient cannot name any medications that she was taking in [place].  Monitor for now.  DM2 -Sliding scale insulin coverage AC/HS for now. -check A1c     DVT prophylaxis: SCDs.  Encourage ambulation. Code Status: FULL Family Communication: Patient alone in the ED at time of admission. Disposition Plan: Expect she will discharge to her son's home when ready. Consults called: ENT Admission status: Inpatient, med surg.  I expect this patient will need inpatient services for greater than two midnights.     Consultants:   ENT  ID  Procedures:   I&D 1/22  Antimicrobials:   Vanc and zosyn    Subjective: Used the translation [facility] [phone] Patient states she feels much better after getting I&D done, doesn't have any trouble swallowing food or breathing. She is comfortable at this time.   Objective: Vitals:   02/11/16 1300 02/11/16 1315 02/11/16 1345 02/11/16 1430  BP: 100/65 107/66 102/68 100/61  Pulse: 73 75 72 66  Resp:      Temp:      TempSrc:      SpO2: 97% 97% 96% 96%   No intake or output data in the 24 hours ending 02/11/16 1507 There were no vitals filed for this visit.  Examination:  General exam: Appears calm and comfortable; significant swelling of  the left jaw with erythema (dressing in place) Respiratory system: Clear to auscultation. Respiratory effort normal. Cardiovascular system: S1 & S2 heard, RRR. No JVD, murmurs, rubs, gallops or clicks. No pedal edema. Gastrointestinal system: Abdomen is nondistended, soft and nontender. No organomegaly or masses felt. Normal bowel sounds heard. Central nervous system: Alert and oriented. No focal neurological deficits. Extremities: Symmetric 5 x 5 power. Skin: erythema of left jaw Psychiatry: Judgement and insight appear normal. Mood & affect appropriate.     Data Reviewed:   CBC:  Recent Labs Lab 02/10/16 1141 02/11/16 0315  WBC 13.7* 10.7*  NEUTROABS 10.2*  --   HGB 14.8 13.2  HCT 43.5 39.4  MCV 86.7 86.4  PLT 287 250   Basic Metabolic Panel:  Recent Labs Lab 02/10/16 1141 02/11/16 0315  NA 135 133*  K 3.7 3.3*  CL 99* 103  CO2 25 24  GLUCOSE 314* 215*  BUN 9 9  CREATININE 0.80 0.58  CALCIUM 9.9 8.8*   GFR: CrCl cannot be calculated (Unknown ideal weight.). Liver Function Tests:  Recent Labs Lab 02/10/16 1141  AST 25  ALT 22  ALKPHOS 123  BILITOT 0.7  PROT 7.7  ALBUMIN 3.2*   No results for input(s): LIPASE, AMYLASE in the last 168 hours. No results for input(s): AMMONIA in the last 168 hours. Coagulation Profile:  Recent Labs Lab 02/11/16 0315  INR 1.23   Cardiac Enzymes: No results  for input(s): CKTOTAL, CKMB, CKMBINDEX, TROPONINI in the last 168 hours. BNP (last 3 results) No results for input(s): PROBNP in the last 8760 hours. HbA1C: No results for input(s): HGBA1C in the last 72 hours. CBG:  Recent Labs Lab 02/10/16 2336 02/11/16 1018 02/11/16 1227  GLUCAP 263* 262* 209*   Lipid Profile: No results for input(s): CHOL, HDL, LDLCALC, TRIG, CHOLHDL, LDLDIRECT in the last 72 hours. Thyroid Function Tests: No results for input(s): TSH, T4TOTAL, FREET4, T3FREE, THYROIDAB in the last 72 hours. Anemia Panel: No results for input(s):  VITAMINB12, FOLATE, FERRITIN, TIBC, IRON, RETICCTPCT in the last 72 hours. Sepsis Labs:  Recent Labs Lab 02/10/16 1204  LATICACIDVEN 1.39    Recent Results (from the past 240 hour(s))  Blood culture (routine x 2)     Status: None (Preliminary result)   Collection Time: 02/10/16  7:53 PM  Result Value Ref Range Status   Specimen Description BLOOD RIGHT HAND  Final   Special Requests BOTTLES DRAWN AEROBIC AND ANAEROBIC 3CC  Final   Culture PENDING  Incomplete   Report Status PENDING  Incomplete  Aerobic/Anaerobic Culture (surgical/deep wound)     Status: None (Preliminary result)   Collection Time: 02/11/16  8:40 AM  Result Value Ref Range Status   Specimen Description ABSCESS LEFT FACE  Final   Special Requests NONE  Final   Gram Stain   Final    MODERATE WBC PRESENT, PREDOMINANTLY PMN ABUNDANT GRAM POSITIVE COCCI IN PAIRS ABUNDANT GRAM NEGATIVE COCCOBACILLI FEW GRAM VARIABLE ROD    Culture PENDING  Incomplete   Report Status PENDING  Incomplete         Radiology Studies: Ct Soft Tissue Neck W Contrast  Result Date: 02/10/2016 CLINICAL DATA:  LEFT facial swelling for 5 days. Evaluate neck abscess. History of diabetes, hypertension. EXAM: CT NECK WITH CONTRAST TECHNIQUE: Multidetector CT imaging of the neck was performed using the standard protocol following the bolus administration of intravenous contrast. CONTRAST:  75mL ISOVUE-300 IOPAMIDOL (ISOVUE-300) INJECTION 61% COMPARISON:  None. FINDINGS: Pharynx and larynx: Trace retropharyngeal effusion, pharynx and larynx are otherwise unremarkable. Salivary glands: Multiloculated 6.2 x 4.6 x 3.4 cm LEFT submandibular irregular cystic peripherally enhancing mass, discontinuous along the lateral margin most consistent with rupture. Hyperemic LEFT submandibular parenchyma without sialolith or definite ductal dilatation. Contiguous extension into the LEFT masseter muscle. Fat stranding anterior aspect LEFT superficial parotid lobe,  otherwise unremarkable. Thyroid: Normal. Lymph nodes: Greater than expected number of non pathologically enlarged and may be reactive however, rounded morphology LEFT level 5 B lymph node. Vascular: Trace calcific atherosclerosis of the aortic arch. Limited intracranial: Normal. Visualized orbits: Normal. Mastoids and visualized paranasal sinuses: LEFT maxillary mucosal retention cyst. No paranasal sinus air-fluid levels. Mastoid air cells are well aerated. Skeleton: Mild degenerative changes cervical spine. No acute osseous process. Poor dentition with multiple periapical abscess. Upper chest: Consolidation with central air RIGHT posterior upper lobe, periphery of LEFT upper lobe. No superior mediastinal lymphadenopathy. Other: Small LEFT neck effusion. Extensive LEFT neck subcutaneous fat stranding without subcutaneous gas or radiopaque foreign bodies. IMPRESSION: 6.2 x 4.6 x 3.4 cm ruptured LEFT submandibular abscess with LEFT masseter muscle extension. Greater than expected number of non pathologically lymph nodes. The findings may represent simple infection, underlying mass is possible and follow-up is recommended. Dense consolidations and upper lobes concerning for pneumonia, neoplasm not excluded. Recommend CT chest with contrast. Electronically Signed   By: Awilda Metroourtnay  Bloomer M.D.   On: 02/10/2016 18:03   Dg Chest Portable  1 View  Result Date: 02/10/2016 CLINICAL DATA:  Known cheek abscess EXAM: PORTABLE CHEST 1 VIEW COMPARISON:  None. FINDINGS: Cardiac shadow is at the upper limits of normal in size but accentuated by the portable technique. The lungs are well aerated bilaterally. No focal infiltrate or sizable effusion is seen. No acute bony abnormality is noted. IMPRESSION: No acute abnormality seen. Electronically Signed   By: Alcide Clever M.D.   On: 02/10/2016 20:34        Scheduled Meds: . insulin aspart  0-20 Units Subcutaneous TID WC  . insulin aspart  0-5 Units Subcutaneous QHS    Continuous Infusions: . sodium chloride 100 mL/hr at 02/10/16 2316  . piperacillin-tazobactam (ZOSYN)  IV 3.375 g (02/11/16 1439)     LOS: 1 day    Time spent: 35 mins     Berania Peedin Joline Maxcy, MD [facility] Pager [phone]   If 7PM-7AM, please contact night-coverage [email or web address] Password [facility] 02/11/2016, 3:07 PM

## 2016-02-11 NOTE — Progress Notes (Signed)
Pharmacy Antibiotic Note  Hannah Gray is a [age] y.o. female admitted on 02/10/2016 with submandibular infxn/abscess.  Pharmacy has been consulted for vancomycin dosing. Pt is afebrile and WBC is mildly elevated at 10.7. Scr is WNL.   Plan: Vanc 1gm IV x 1 then 750mg  IV Q12H F/u renal fxn, C&S, [facility]     No data recorded.   Recent Labs Lab 02/10/16 1141 02/10/16 1204 02/11/16 0315  WBC 13.7*  --  10.7*  CREATININE 0.80  --  0.58  LATICACIDVEN  --  1.39  --     CrCl cannot be calculated (Unknown ideal weight.).    No Known Allergies  Antimicrobials this admission: Vanc 1/23>> Zosyn 1/22>> Clinda x 1 1/22  Dose adjustments this admission: N/A  Microbiology results: Pending  Thank you for allowing pharmacy to be a part of this patient's care.  Hannah Gray, Hannah Gray 02/11/2016 3:25 PM

## 2016-02-11 NOTE — Consult Note (Signed)
[facility]       Reason for Consult: submandibular abscess    Referring Physician: Dr.Amin  Principal Problem:   Neck mass Active Problems:   HTN (hypertension)   Diabetes (HCC)   Abnormal CT scan, neck   Leukocytosis   . insulin aspart  0-20 Units Subcutaneous TID WC  . insulin aspart  0-5 Units Subcutaneous QHS  . [START ON 02/12/2016] vancomycin  750 mg Intravenous Q12H    Recommendations: Continue with zosyn Will narrow based on culture  Routine HIV  Assessment: She has a submandibular abscess   Antibiotics: Zosyn Previously on one from [place], unknown which  HPI: Hannah Gray is a 56 y.o. female with DM, HTN, here visiting from [place] with about 1 week of progressive swelling of face on left side.  No bites, no acute dental issues, no dental pain.  Had I and D by ENT today and feels much better.  No fever, no chills.  No history of infection there.  Unable to eat but hungry now.  Some recent weight loss.  CT with multiloculated areas of abscess.  Draining now.   CT independently reviewed  Review of Systems:  Constitutional: negative for fevers, chills and malaise Gastrointestinal: negative for diarrhea Integument/breast: negative for rash Musculoskeletal: negative for myalgias and arthralgias All other systems reviewed and are negative    Past Medical History:  Diagnosis Date  . Diabetes mellitus without complication (HCC)   . Hypertension     Social History  Substance Use Topics  . Smoking status: Current Every Day Smoker    Packs/day: 0.25  . Smokeless tobacco: Never Used  . Alcohol use No    Family History  Problem Relation Age of Onset  . Leukemia Neg Hx   . Lymphoma Neg Hx   . Head & neck cancer Neg Hx   . Diabetes Neg Hx   . Hypertension Neg Hx     No Known Allergies  Physical Exam: Constitutional: in no apparent distress and alert  Vitals:   02/11/16 1345 02/11/16 1430  BP: 102/68 100/61    Pulse: 72 66  Resp:    Temp:     EYES: anicteric ENMT: no thrush Cardiovascular: Cor RRR Respiratory: CTA B; normal respiratory effort GI: Bowel sounds are normal, liver is not enlarged, spleen is not enlarged Musculoskeletal: no pedal edema noted Skin: negatives: no rash Neuro: non focal  Lab Results  Component Value Date   WBC 10.7 (H) 02/11/2016   HGB 13.2 02/11/2016   HCT 39.4 02/11/2016   MCV 86.4 02/11/2016   PLT 250 02/11/2016    Lab Results  Component Value Date   CREATININE 0.58 02/11/2016   BUN 9 02/11/2016   NA 133 (L) 02/11/2016   K 3.3 (L) 02/11/2016   CL 103 02/11/2016   CO2 24 02/11/2016    Lab Results  Component Value Date   ALT 22 02/10/2016   AST 25 02/10/2016   ALKPHOS 123 02/10/2016     Microbiology: Recent Results (from the past 240 hour(s))  Blood culture (routine x 2)     Status: None (Preliminary result)   Collection Time: 02/10/16  7:53 PM  Result Value Ref Range Status   Specimen Description BLOOD RIGHT HAND  Final   Special Requests BOTTLES DRAWN AEROBIC AND ANAEROBIC 3CC  Final   Culture NO GROWTH < 24 HOURS  Final   Report Status PENDING  Incomplete  Blood culture (routine x 2)  Status: None (Preliminary result)   Collection Time: 02/10/16  8:36 PM  Result Value Ref Range Status   Specimen Description BLOOD RIGHT HAND  Final   Special Requests BOTTLES DRAWN AEROBIC AND ANAEROBIC 5CC  Final   Culture NO GROWTH < 24 HOURS  Final   Report Status PENDING  Incomplete  Aerobic/Anaerobic Culture (surgical/deep wound)     Status: None (Preliminary result)   Collection Time: 02/11/16  8:40 AM  Result Value Ref Range Status   Specimen Description ABSCESS LEFT FACE  Final   Special Requests NONE  Final   Gram Stain   Final    MODERATE WBC PRESENT, PREDOMINANTLY PMN ABUNDANT GRAM POSITIVE COCCI IN PAIRS ABUNDANT GRAM NEGATIVE COCCOBACILLI FEW GRAM VARIABLE ROD    Culture PENDING  Incomplete   Report Status PENDING  Incomplete     Staci Righter, MD [facility] [email or web address] [phone] pager  [phone] cell 02/11/2016, 3:38 PM

## 2016-02-11 NOTE — ED Notes (Signed)
Pt did not need anything at this time  

## 2016-02-11 NOTE — ED Notes (Addendum)
CT called regarding pt scan, per Dr. Montez Moritaarter, pt will have scan later today.

## 2016-02-11 NOTE — Progress Notes (Signed)
Inpatient Diabetes Program Recommendations  AACE/ADA: New Consensus Statement on Inpatient Glycemic Control (2015)  Target Ranges:  Prepandial:   less than 140 mg/dL      Peak postprandial:   less than 180 mg/dL (1-2 hours)      Critically ill patients:  140 - 180 mg/dL   Lab Results  Component Value Date   GLUCAP 209 (H) 02/11/2016    Review of Glycemic Control Results for Hannah MenghiniGARCIAMUCINO, Kammy (MRN [id number]) as of 02/11/2016 13:23  Ref. Range 02/10/2016 23:36 02/11/2016 10:18 02/11/2016 12:27  Glucose-Capillary Latest Ref Range: 65 - 99 mg/dL 147263 (H) 829262 (H) 562209 (H)   Inpatient Diabetes Program Recommendations:  Please consider A1c to determine prehospital glycemic control. May need basal insulin in addition to Novolog correction.  Thank you, Billy FischerJudy E. Alease Fait, RN, MSN, CDE Inpatient Glycemic Control Team Team Pager [phone] (8am-5pm) 02/11/2016 1:24 PM

## 2016-02-11 NOTE — ED Notes (Signed)
Plan of care discussed with pt via Spanish interpreter. Pt verbalized understanding.

## 2016-02-11 NOTE — Progress Notes (Signed)
New Admission Note:    Arrival Method: From ED via stretcher Mental Orientation: Alert  Assessment: Pt is spanish speaking Skin: Right Facial Swelling IV:RAC Pain: FLACC 3/10 Safety Measures:Siderails up and callbell in reach Admission: Admission Nurse Notified 6E Orientation: yes Family:  None at bedside Orders have been reviewed and implemented.  Will continue to monitor the patient.

## 2016-02-12 DIAGNOSIS — R918 Other nonspecific abnormal finding of lung field: Secondary | ICD-10-CM

## 2016-02-12 LAB — GLUCOSE, CAPILLARY
GLUCOSE-CAPILLARY: 160 mg/dL — AB (ref 65–99)
Glucose-Capillary: 168 mg/dL — ABNORMAL HIGH (ref 65–99)
Glucose-Capillary: 169 mg/dL — ABNORMAL HIGH (ref 65–99)
Glucose-Capillary: 183 mg/dL — ABNORMAL HIGH (ref 65–99)

## 2016-02-12 MED ORDER — GLUCERNA SHAKE PO LIQD
237.0000 mL | Freq: Three times a day (TID) | ORAL | Status: DC
  Administered 2016-02-12 – 2016-02-16 (×11): 237 mL via ORAL

## 2016-02-12 NOTE — Consult Note (Addendum)
WOC consult requested to clarify dressing changes when patient is discharged home.  Left lower cheek with full thickness wound; I&D was performed by ENT service for abscess area yesterday, refer to their consult notes.  Called Dr Jeremy Johannech of that service to clairify the plan of care.  He states he was in to see the patient earlier and removed the packing which had previously been inserted, he requested that the wound be left open to drain.  Plan of care is prescribed as below:  Wash left cheek with soap and water Q day, then cover with ABD pad and gauze to hold in place.  Change ABD pads PRN when soiled.  Left lower cheek full thickness wound .3X.3X1.5cm when swab inserted, surrounding area to 10cm with generalized edema and erythremia, painful to touch.  Yellow moist wound bed with mod amt dark reddish tan drainage, no odor. Created a soft collar out of mesh stockinet to hold the ABD pad in place to absorb drainage. Demonstrated process to patient and she nodded understanding.  Please re-consult if further assistance is needed.  Thank-you,  Cammie Mcgeeawn Troy Hartzog MSN, RN, CWOCN, HennesseyWCN-AP, CNS [phone]

## 2016-02-12 NOTE — Progress Notes (Signed)
PROGRESS NOTE    Hannah Gray  ZOX:[id number] DOB: 02-Apr-1960 DOA: 02/10/2016 PCP: No PCP Per Patient   Brief Narrative:  Patient admitted for submandibular abscess s/p I&D. Seen by ENT and now on IV Abx.    Assessment & Plan:   Principal Problem:   Neck mass Active Problems:   HTN (hypertension)   Diabetes (HCC)   Abnormal CT scan, neck   Leukocytosis  Complicated left submandibular mass s/p I&D 02/10/16 -ENT following, cont IV abx for now  -Cont Zosyn, will add van -Infectious Disease following, awaiting cultures at this time.  -advance diet as tolerated  -Analgesics as needed -seen by wound care and showed patient how to do it at home if needed.   Incidental lung findings on CT neck -CT chest with contrast- trace b/l pleural fluid, maybe small septic emboli in setting of her neck abscess and enlarged subcarinal node.   HTN -Patient cannot name any medications that she was taking in [place].  Monitor for now. -its been stable here.   DM2 -Sliding scale insulin coverage AC/HS for now. -A1c- in process      DVT prophylaxis: SCDs.  Encourage ambulation. Code Status: FULL Family Communication: Patient is able to comprehend as a Nurse, learning disability was used.  Disposition Plan: Possible discharge once determine her infection is stable and able to narrow down Abx.  Consults called: ENT Admission status: Inpatient, med surg.  Consultants:   ENT  ID  Procedures:   I&D 1/22  Antimicrobials:   Vanc and zosyn    Subjective: Used the interpreter - Hannah Gray Patient is able to eat and drink well without any complaints. She feels much better and thinks her swelling is down. He remains afebrile.   Objective: Vitals:   02/11/16 1702 02/11/16 2140 02/12/16 0545 02/12/16 1000  BP: (!) 100/59 119/73 (!) 101/59 106/65  Pulse: 63 85 62 62  Resp: 17 16 17 18   Temp: 99.6 F (37.6 C) 99.6 F (37.6 C) 98.2 F (36.8 C) 98.8 F (37.1 C)  TempSrc: Oral  Oral Oral Oral  SpO2: 95% 94% 95% 96%    Intake/Output Summary (Last 24 hours) at 02/12/16 1144 Last data filed at 02/12/16 0900  Gross per 24 hour  Intake             1010 ml  Output                0 ml  Net             1010 ml   There were no vitals filed for this visit.  Examination:  General exam: Appears calm and comfortable; significant swelling of the left jaw with erythema (dressing in place) tender to touch. About 10 cm in size.  Respiratory system: Clear to auscultation. Respiratory effort normal. Cardiovascular system: S1 & S2 heard, RRR. No JVD, murmurs, rubs, gallops or clicks. No pedal edema. Gastrointestinal system: Abdomen is nondistended, soft and nontender. No organomegaly or masses felt. Normal bowel sounds heard. Central nervous system: Alert and oriented. No focal neurological deficits. Extremities: Symmetric 5 x 5 power. Skin: erythema of left jaw Psychiatry: Judgement and insight appear normal. Mood & affect appropriate.     Data Reviewed:   CBC:  Recent Labs Lab 02/10/16 1141 02/11/16 0315  WBC 13.7* 10.7*  NEUTROABS 10.2*  --   HGB 14.8 13.2  HCT 43.5 39.4  MCV 86.7 86.4  PLT 287 250   Basic Metabolic Panel:  Recent Labs Lab 02/10/16 1141  02/11/16 0315  NA 135 133*  K 3.7 3.3*  CL 99* 103  CO2 25 24  GLUCOSE 314* 215*  BUN 9 9  CREATININE 0.80 0.58  CALCIUM 9.9 8.8*   GFR: CrCl cannot be calculated (Unknown ideal weight.). Liver Function Tests:  Recent Labs Lab 02/10/16 1141  AST 25  ALT 22  ALKPHOS 123  BILITOT 0.7  PROT 7.7  ALBUMIN 3.2*   No results for input(s): LIPASE, AMYLASE in the last 168 hours. No results for input(s): AMMONIA in the last 168 hours. Coagulation Profile:  Recent Labs Lab 02/11/16 0315  INR 1.23   Cardiac Enzymes: No results for input(s): CKTOTAL, CKMB, CKMBINDEX, TROPONINI in the last 168 hours. BNP (last 3 results) No results for input(s): PROBNP in the last 8760 hours. HbA1C: No  results for input(s): HGBA1C in the last 72 hours. CBG:  Recent Labs Lab 02/11/16 1018 02/11/16 1227 02/11/16 1703 02/11/16 2138 02/12/16 0801  GLUCAP 262* 209* 221* 159* 168*   Lipid Profile: No results for input(s): CHOL, HDL, LDLCALC, TRIG, CHOLHDL, LDLDIRECT in the last 72 hours. Thyroid Function Tests: No results for input(s): TSH, T4TOTAL, FREET4, T3FREE, THYROIDAB in the last 72 hours. Anemia Panel: No results for input(s): VITAMINB12, FOLATE, FERRITIN, TIBC, IRON, RETICCTPCT in the last 72 hours. Sepsis Labs:  Recent Labs Lab 02/10/16 1204  LATICACIDVEN 1.39    Recent Results (from the past 240 hour(s))  Blood culture (routine x 2)     Status: None (Preliminary result)   Collection Time: 02/10/16  7:53 PM  Result Value Ref Range Status   Specimen Description BLOOD RIGHT HAND  Final   Special Requests BOTTLES DRAWN AEROBIC AND ANAEROBIC 3CC  Final   Culture NO GROWTH < 24 HOURS  Final   Report Status PENDING  Incomplete  Blood culture (routine x 2)     Status: None (Preliminary result)   Collection Time: 02/10/16  8:36 PM  Result Value Ref Range Status   Specimen Description BLOOD RIGHT HAND  Final   Special Requests BOTTLES DRAWN AEROBIC AND ANAEROBIC 5CC  Final   Culture NO GROWTH < 24 HOURS  Final   Report Status PENDING  Incomplete  Aerobic/Anaerobic Culture (surgical/deep wound)     Status: None (Preliminary result)   Collection Time: 02/11/16  8:40 AM  Result Value Ref Range Status   Specimen Description ABSCESS LEFT FACE  Final   Special Requests NONE  Final   Gram Stain   Final    MODERATE WBC PRESENT, PREDOMINANTLY PMN ABUNDANT GRAM POSITIVE COCCI IN PAIRS ABUNDANT GRAM NEGATIVE COCCOBACILLI FEW GRAM VARIABLE ROD    Culture PENDING  Incomplete   Report Status PENDING  Incomplete         Radiology Studies: Ct Soft Tissue Neck W Contrast  Result Date: 02/10/2016 CLINICAL DATA:  LEFT facial swelling for 5 days. Evaluate neck abscess. History  of diabetes, hypertension. EXAM: CT NECK WITH CONTRAST TECHNIQUE: Multidetector CT imaging of the neck was performed using the standard protocol following the bolus administration of intravenous contrast. CONTRAST:  75mL ISOVUE-300 IOPAMIDOL (ISOVUE-300) INJECTION 61% COMPARISON:  None. FINDINGS: Pharynx and larynx: Trace retropharyngeal effusion, pharynx and larynx are otherwise unremarkable. Salivary glands: Multiloculated 6.2 x 4.6 x 3.4 cm LEFT submandibular irregular cystic peripherally enhancing mass, discontinuous along the lateral margin most consistent with rupture. Hyperemic LEFT submandibular parenchyma without sialolith or definite ductal dilatation. Contiguous extension into the LEFT masseter muscle. Fat stranding anterior aspect LEFT superficial parotid lobe, otherwise unremarkable.  Thyroid: Normal. Lymph nodes: Greater than expected number of non pathologically enlarged and may be reactive however, rounded morphology LEFT level 5 B lymph node. Vascular: Trace calcific atherosclerosis of the aortic arch. Limited intracranial: Normal. Visualized orbits: Normal. Mastoids and visualized paranasal sinuses: LEFT maxillary mucosal retention cyst. No paranasal sinus air-fluid levels. Mastoid air cells are well aerated. Skeleton: Mild degenerative changes cervical spine. No acute osseous process. Poor dentition with multiple periapical abscess. Upper chest: Consolidation with central air RIGHT posterior upper lobe, periphery of LEFT upper lobe. No superior mediastinal lymphadenopathy. Other: Small LEFT neck effusion. Extensive LEFT neck subcutaneous fat stranding without subcutaneous gas or radiopaque foreign bodies. IMPRESSION: 6.2 x 4.6 x 3.4 cm ruptured LEFT submandibular abscess with LEFT masseter muscle extension. Greater than expected number of non pathologically lymph nodes. The findings may represent simple infection, underlying mass is possible and follow-up is recommended. Dense consolidations and  upper lobes concerning for pneumonia, neoplasm not excluded. Recommend CT chest with contrast. Electronically Signed   By: Awilda Metro M.D.   On: 02/10/2016 18:03   Ct Chest W Contrast  Result Date: 02/11/2016 CLINICAL DATA:  Lung consolidation noted on CT of the neck. Further evaluation requested. Initial encounter. EXAM: CT CHEST WITH CONTRAST TECHNIQUE: Multidetector CT imaging of the chest was performed during intravenous contrast administration. CONTRAST:  75 mL ISOVUE-300 IOPAMIDOL (ISOVUE-300) INJECTION 61% COMPARISON:  CT of the neck performed 02/10/2016 FINDINGS: Cardiovascular: The heart is normal in size. The thoracic aorta is grossly unremarkable. The great vessels are within normal limits. Mediastinum/Nodes: A mildly enlarged 1.1 cm subcarinal node is seen. Remaining visualized mediastinal nodes are normal in size. No pericardial effusion is identified. The visualized portions of the thyroid gland are unremarkable. No axillary lymphadenopathy is seen. Lungs/Pleura: Trace bilateral pleural fluid is noted. A calcified granuloma is noted at the right midlung zone. Mild peripheral wedge-shaped airspace opacities within both upper lobes may reflect small septic emboli, especially given the patient's large neck abscess. Mild bibasilar atelectasis is seen. No dominant mass is identified. Upper Abdomen: The visualized portions of the liver and spleen are unremarkable. The visualized portions of the gallbladder, pancreas, adrenal glands and left kidney are within normal limits, aside from prominent scarring at the right kidney. Musculoskeletal: No acute osseous abnormalities are identified. The visualized musculature is unremarkable in appearance. IMPRESSION: 1. Trace bilateral pleural fluid, with mild bibasilar atelectasis. Mild peripheral wedge-shaped airspace opacities within both upper lung lobes may reflect small septic emboli, especially given the patient's large neck abscess. No dominant mass  seen. 2. Mildly enlarged 1.1 cm subcarinal node may reflect the underlying acute infection. 3. Scarring at the right kidney. Electronically Signed   By: Roanna Raider M.D.   On: 02/11/2016 19:18   Dg Chest Portable 1 View  Result Date: 02/10/2016 CLINICAL DATA:  Known cheek abscess EXAM: PORTABLE CHEST 1 VIEW COMPARISON:  None. FINDINGS: Cardiac shadow is at the upper limits of normal in size but accentuated by the portable technique. The lungs are well aerated bilaterally. No focal infiltrate or sizable effusion is seen. No acute bony abnormality is noted. IMPRESSION: No acute abnormality seen. Electronically Signed   By: Alcide Clever M.D.   On: 02/10/2016 20:34        Scheduled Meds: . feeding supplement  1 Container Oral TID BM  . insulin aspart  0-20 Units Subcutaneous TID WC  . insulin aspart  0-5 Units Subcutaneous QHS  . piperacillin-tazobactam (ZOSYN)  IV  3.375 g Intravenous Q8H  .  vancomycin  750 mg Intravenous Q12H   Continuous Infusions: . sodium chloride 100 mL/hr at 02/10/16 2316     LOS: 2 days    Time spent: 35 mins     Emmani Lesueur Joline Maxcyhirag Renato Spellman, MD [facility] Pager [phone]   If 7PM-7AM, please contact night-coverage [email or web address] Password [email or web address] 02/12/2016, 11:44 AM

## 2016-02-12 NOTE — Plan of Care (Signed)
Problem: Education: Goal: Knowledge of [facility] Education information/materials will improve Outcome: Progressing POC reviewed with pt. and son; pt. speaks little [place] and son translated at the beginning of shift for me. Explained to son that we can use interpretor phone if needed.

## 2016-02-12 NOTE — Progress Notes (Signed)
Subjective: Pt reports significant improvement in her facial pain.  Objective: Vital signs in last 24 hours: Temp:  [98.2 F (36.8 C)-99.6 F (37.6 C)] 98.2 F (36.8 C) (01/24 0545) Pulse Rate:  [62-85] 62 (01/24 0545) Resp:  [16-20] 17 (01/24 0545) BP: (97-119)/(59-73) 101/59 (01/24 0545) SpO2:  [94 %-97 %] 95 % (01/24 0545)  Physical Exam Constitutional: She appears well-developedand well-nourished. No distress.  Head: Normocephalicand atraumatic.  Face: Large approx 8cm x 10cm area of erythemaand swelling over the left lower face. Purulent drainage from the I&D site. Ears: Normal auricles and EACs. Nose: Normal mucosa. Mouth: Normal mucosa. Floor of mouth soft to palpation. Eyes: Conjunctivaeare normal. PERRL, EOMI. Cardiovascular: Normal rateand regular rhythm.  Pulmonary/Chest: Effort normaland breath sounds normal. No respiratory distress.  Neurological: She is alert.  Skin: Skin is warmand dry. She is not diaphoretic.  Psychiatric: She has a normal mood and affect.    Recent Labs  02/10/16 1141 02/11/16 0315  WBC 13.7* 10.7*  HGB 14.8 13.2  HCT 43.5 39.4  PLT 287 250    Recent Labs  02/10/16 1141 02/11/16 0315  NA 135 133*  K 3.7 3.3*  CL 99* 103  CO2 25 24  GLUCOSE 314* 215*  BUN 9 9  CREATININE 0.80 0.58  CALCIUM 9.9 8.8*    Medications:  I have reviewed the patient's current medications. Scheduled: . feeding supplement  1 Container Oral TID BM  . insulin aspart  0-20 Units Subcutaneous TID WC  . insulin aspart  0-5 Units Subcutaneous QHS  . piperacillin-tazobactam (ZOSYN)  IV  3.375 g Intravenous Q8H  . vancomycin  750 mg Intravenous Q12H   UJW:[id number]:acetaminophen **OR** acetaminophen, ondansetron **OR** ondansetron (ZOFRAN) IV  Assessment/Plan: Left facial/submandibular abscess. I&D performed in the ER yesterday without difficulty. Culture pending. Continue with IV antibiotics.   LOS: 2 days   Moira Umholtz,SUI W 02/12/2016, 9:06 AM

## 2016-02-12 NOTE — Progress Notes (Signed)
Interpreter Wyvonnia DuskyGraciela Namihira for Dr Nelson ChimesAmin and Caryl AspKellie RN

## 2016-02-12 NOTE — Progress Notes (Signed)
Interpreter at bedside as RN did patients assessment. Interpreter Name was GuernseyGraciela. Ph: [phone]

## 2016-02-12 NOTE — Progress Notes (Signed)
Initial Nutrition Assessment  DOCUMENTATION CODES:   Not applicable  INTERVENTION:  Discontinue Boost Breeze.  Provide Glucerna Shake po TID, each supplement provides 220 kcal and 10 grams of protein.  Recommend obtaining new weight and height measurement.   NUTRITION DIAGNOSIS:   Inadequate oral intake related to  (neck mass) as evidenced by per patient/family report.  GOAL:   Patient will meet greater than or equal to 90% of their needs  MONITOR:   PO intake, Supplement acceptance, Labs, Weight trends, Skin, I & O's  REASON FOR ASSESSMENT:   Malnutrition Screening Tool    ASSESSMENT:   56 y.o. woman with a history of HTN and DM who is [place], from [place]. The patient reports that she has had five days of progressive swelling just below her left jaw.  She denies any bites or new exposures.  She denies any tooth pain or known history of tooth abscesses.  No fever, no shortness of breath.  She has difficulty opening mouth due to the swelling, so she has been on a liquid diet.  She denies signs or symptoms of aspiration.  She reports night sweats and a weight loss of 20 kilos in the past three months. S/p I&D yesterday in ER.   Diet has just been advanced to a carb modified diet from a clear liquid. Pt reports she has been able to consume some juice and water. RD unable to obtain additional nutrition history at this time. No family at bedside. Noted no recent weight or height recorded. Pt was weighed on bed scale during time of visit which revealed 171 lbs. Pt currently has Boost Breeze ordered. RD to modify orders and order Glucerna Shake instead to aid in adequate nutrtion as diet has been advanced.   Pt with no observed significant fat or muscle mass loss.   Labs and medications reviewed. CBGs 160-263 mg/dL.   Diet Order:  Diet Carb Modified Fluid consistency: Thin; Room service appropriate? Yes  Skin:  Wound (see comment) (Incision on throat)  Last BM:   1/23  Height:   Ht Readings from Last 1 Encounters:  No data found for Ht    Weight:   Wt Readings from Last 1 Encounters:  No data found for Wt  Bed Scale revealed 171 lbs (77.7 kg)  Ideal Body Weight:    N/A  BMI:  There is no height or weight on file to calculate BMI.  Estimated Nutritional Needs:   Kcal:  1800-2000  Protein:  75-85 grams  Fluid:  1.8 - 2 L/day  EDUCATION NEEDS:   No education needs identified at this time  Roslyn SmilingStephanie Sammi Stolarz, MS, RD, LDN Pager # [phone] After hours/ weekend pager # [phone]

## 2016-02-12 NOTE — Care Management Note (Signed)
Case Management Note  Patient Details  Name: Melodye PedMartha Garcia Mucino MRN: [id number] Date of Birth: 15-Jun-1960  Subjective/Objective:    CM following for progression and d/c planning.                 Action/Plan: 02/12/2016 Noted CM [facility] assistance with dressing changes. This CM [facility] and possible d/c needs. They are currently working [facility]. HH services will require that the family/ friend be taught to change this dressing as HH services are not available daily for any pt.   Expected Discharge Date:                  Expected Discharge Plan:  Home w Home Health Services  In-House Referral:  NA  Discharge planning Services  CM Consult  Post Acute Care Choice:  Home Health Choice offered to:  NA  DME Arranged:  N/A DME Agency:  NA  HH Arranged:  RN HH Agency:  [facility]  Status of Service:  In process, will continue to follow  If discussed at Long Length of Stay Meetings, dates discussed:    Additional Comments:  Starlyn SkeansRoyal, Erastus Bartolomei U, RN 02/12/2016, 11:10 AM

## 2016-02-12 NOTE — Progress Notes (Signed)
    [facility]   Reason for visit: Follow up on submandibular abscess  Interval History: minimal drainage, no current pain, no fever, WBC down to 10; no associated diarrhea; asking when she can be discharged  Physical Exam: Constitutional:  Vitals:   02/12/16 0545 02/12/16 1000  BP: (!) 101/59 106/65  Pulse: 62 62  Resp: 17 18  Temp: 98.2 F (36.8 C) 98.8 F (37.1 C)   patient appears in NAD Eyes: anicteric HENT: minimal drainage at site Respiratory: Normal respiratory effort; CTA B Cardiovascular: RRR GI: soft, nt, nd  Review of Systems: Constitutional: negative for fevers and chills Ears, nose, mouth, throat, and face: negative for drainage or pain Gastrointestinal: negative for diarrhea Integument/breast: negative for rash  Lab Results  Component Value Date   WBC 10.7 (H) 02/11/2016   HGB 13.2 02/11/2016   HCT 39.4 02/11/2016   MCV 86.4 02/11/2016   PLT 250 02/11/2016    Lab Results  Component Value Date   CREATININE 0.58 02/11/2016   BUN 9 02/11/2016   NA 133 (L) 02/11/2016   K 3.3 (L) 02/11/2016   CL 103 02/11/2016   CO2 24 02/11/2016    Lab Results  Component Value Date   ALT 22 02/10/2016   AST 25 02/10/2016   ALKPHOS 123 02/10/2016     Microbiology: Recent Results (from the past 240 hour(s))  Blood culture (routine x 2)     Status: None (Preliminary result)   Collection Time: 02/10/16  7:53 PM  Result Value Ref Range Status   Specimen Description BLOOD RIGHT HAND  Final   Special Requests BOTTLES DRAWN AEROBIC AND ANAEROBIC 3CC  Final   Culture NO GROWTH 2 DAYS  Final   Report Status PENDING  Incomplete  Blood culture (routine x 2)     Status: None (Preliminary result)   Collection Time: 02/10/16  8:36 PM  Result Value Ref Range Status   Specimen Description BLOOD RIGHT HAND  Final   Special Requests BOTTLES DRAWN AEROBIC AND ANAEROBIC 5CC  Final   Culture NO GROWTH 2 DAYS  Final   Report Status PENDING   Incomplete  Aerobic/Anaerobic Culture (surgical/deep wound)     Status: None (Preliminary result)   Collection Time: 02/11/16  8:40 AM  Result Value Ref Range Status   Specimen Description ABSCESS LEFT FACE  Final   Special Requests NONE  Final   Gram Stain   Final    MODERATE WBC PRESENT, PREDOMINANTLY PMN ABUNDANT GRAM POSITIVE COCCI IN PAIRS ABUNDANT GRAM NEGATIVE COCCOBACILLI FEW GRAM VARIABLE ROD    Culture NO GROWTH 1 DAY  Final   Report Status PENDING  Incomplete    Impression/Plan:  1. Submandibular abscess - no growth to date.  Gram stain with GPC in pairs, GNR.  On zosyn.  Vancomycin added by Dr. Nelson ChimesAmin.   Wait for culture If no growth, would send out on Augmentin 875 twice a day or 500 mg three times per day Can keep on IV while here   2.  Lung opacities - concern for septic emboli on CT.  Negative blood cultures.  If blood cultures positive, I would do an echo.

## 2016-02-13 ENCOUNTER — Inpatient Hospital Stay (HOSPITAL_COMMUNITY): Payer: Self-pay

## 2016-02-13 LAB — BASIC METABOLIC PANEL
ANION GAP: 6 (ref 5–15)
Anion gap: 7 (ref 5–15)
BUN: 13 mg/dL (ref 6–20)
BUN: 13 mg/dL (ref 6–20)
CALCIUM: 8.9 mg/dL (ref 8.9–10.3)
CO2: 28 mmol/L (ref 22–32)
CO2: 29 mmol/L (ref 22–32)
CREATININE: 1.81 mg/dL — AB (ref 0.44–1.00)
Calcium: 8.7 mg/dL — ABNORMAL LOW (ref 8.9–10.3)
Chloride: 106 mmol/L (ref 101–111)
Chloride: 106 mmol/L (ref 101–111)
Creatinine, Ser: 1.76 mg/dL — ABNORMAL HIGH (ref 0.44–1.00)
GFR calc Af Amer: 35 mL/min — ABNORMAL LOW (ref >60)
GFR calc non Af Amer: 30 mL/min — ABNORMAL LOW (ref >60)
GFR calc non Af Amer: 31 mL/min — ABNORMAL LOW (ref >60)
GFR, EST AFRICAN AMERICAN: 36 mL/min — AB (ref >60)
GLUCOSE: 186 mg/dL — AB (ref 65–99)
Glucose, Bld: 149 mg/dL — ABNORMAL HIGH (ref 65–99)
POTASSIUM: 3.7 mmol/L (ref 3.5–5.1)
Potassium: 4.1 mmol/L (ref 3.5–5.1)
SODIUM: 141 mmol/L (ref 135–145)
Sodium: 141 mmol/L (ref 135–145)

## 2016-02-13 LAB — HEMOGLOBIN A1C
HEMOGLOBIN A1C: 14 % — AB (ref 4.8–5.6)
MEAN PLASMA GLUCOSE: 355 mg/dL

## 2016-02-13 LAB — GLUCOSE, CAPILLARY
GLUCOSE-CAPILLARY: 175 mg/dL — AB (ref 65–99)
GLUCOSE-CAPILLARY: 185 mg/dL — AB (ref 65–99)
Glucose-Capillary: 172 mg/dL — ABNORMAL HIGH (ref 65–99)
Glucose-Capillary: 231 mg/dL — ABNORMAL HIGH (ref 65–99)

## 2016-02-13 LAB — VANCOMYCIN, RANDOM: Vancomycin Rm: 17

## 2016-02-13 LAB — HIV ANTIBODY (ROUTINE TESTING W REFLEX): HIV SCREEN 4TH GENERATION: NONREACTIVE

## 2016-02-13 MED ORDER — PIPERACILLIN-TAZOBACTAM 3.375 G IVPB
3.3750 g | Freq: Three times a day (TID) | INTRAVENOUS | Status: DC
  Administered 2016-02-13 – 2016-02-14 (×3): 3.375 g via INTRAVENOUS
  Filled 2016-02-13 (×4): qty 50

## 2016-02-13 MED ORDER — DOXYCYCLINE HYCLATE 100 MG PO TABS
100.0000 mg | ORAL_TABLET | Freq: Two times a day (BID) | ORAL | Status: DC
  Administered 2016-02-13: 100 mg via ORAL
  Filled 2016-02-13: qty 1

## 2016-02-13 MED ORDER — VANCOMYCIN HCL IN DEXTROSE 1-5 GM/200ML-% IV SOLN
1000.0000 mg | INTRAVENOUS | Status: DC
  Administered 2016-02-13: 1000 mg via INTRAVENOUS
  Filled 2016-02-13 (×2): qty 200

## 2016-02-13 MED ORDER — AMOXICILLIN-POT CLAVULANATE 500-125 MG PO TABS: 1.0000 | ORAL_TABLET | Freq: Three times a day (TID) | ORAL | 0 refills | Status: DC

## 2016-02-13 MED ORDER — DOXYCYCLINE HYCLATE 100 MG PO TABS: 100.0000 mg | ORAL_TABLET | Freq: Two times a day (BID) | ORAL | 0 refills | Status: DC

## 2016-02-13 MED ORDER — AMOXICILLIN-POT CLAVULANATE 500-125 MG PO TABS
1.0000 | ORAL_TABLET | Freq: Three times a day (TID) | ORAL | Status: DC
  Administered 2016-02-13 (×2): 500 mg via ORAL
  Filled 2016-02-13 (×2): qty 1

## 2016-02-13 NOTE — Progress Notes (Signed)
Amin, MD stated that he would order a renal ultrasound and ECHO to assess high creatinine levels. Patient updated of this information via interpreter. No further questions reported from the patient. Patients daughter, Adella NissenKristal, also updated of this information and questions were answered. Will continue to monitor.

## 2016-02-13 NOTE — Progress Notes (Signed)
Spanish interpreter used for medication administration and assessment.

## 2016-02-13 NOTE — Progress Notes (Addendum)
    [facility]   Reason for visit: Follow up on abscess  Interval History: no growth  Physical Exam: Constitutional:  Vitals:   02/12/16 2107 02/13/16 0515  BP: 98/70 109/68  Pulse: (!) 56 (!) 55  Resp: 18 19  Temp: 98.6 F (37 C) 98.3 F (36.8 C)   patient appears in NAD  Impression: Stable abscess  Plan: 1. Wound care per ENT 2.  Can send out on Augmentin 875 bid or 500 tid and doxycycline 100 mg bid for 7 days 3.  I will arrange follow up in our office  Thanks, I will sign off

## 2016-02-13 NOTE — Progress Notes (Signed)
Subjective: Pt denies any facial pain today.  Objective: Vital signs in last 24 hours: Temp:  [98.3 F (36.8 C)-98.8 F (37.1 C)] 98.3 F (36.8 C) (01/25 0515) Pulse Rate:  [55-70] 55 (01/25 0515) Resp:  [17-19] 19 (01/25 0515) BP: (98-109)/(65-70) 109/68 (01/25 0515) SpO2:  [96 %-100 %] 100 % (01/25 0515)  Physical Exam Constitutional: She appears well-developedand well-nourished. No distress.  Head: Normocephalicand atraumatic.  Face: The left submandibular erythema and edema have significantly improved. Slight purulent drainage from the I&D site. Ears: Normal auricles and EACs. Nose: Normal mucosa. Mouth: Normal mucosa. Floor of mouth soft to palpation. Eyes: Conjunctivaeare normal. PERRL, EOMI. Pulmonary/Chest: Effort normaland breath sounds normal. No respiratory distress.  Neurological: She is alert.    Recent Labs  02/10/16 1141 02/11/16 0315  WBC 13.7* 10.7*  HGB 14.8 13.2  HCT 43.5 39.4  PLT 287 250    Recent Labs  02/11/16 0315 02/13/16 0454  NA 133* 141  K 3.3* 4.1  CL 103 106  CO2 24 28  GLUCOSE 215* 186*  BUN 9 13  CREATININE 0.58 1.81*  CALCIUM 8.8* 8.9    Medications:  I have reviewed the patient's current medications. Scheduled: . feeding supplement (GLUCERNA SHAKE)  237 mL Oral TID BM  . insulin aspart  0-20 Units Subcutaneous TID WC  . insulin aspart  0-5 Units Subcutaneous QHS  . piperacillin-tazobactam (ZOSYN)  IV  3.375 g Intravenous Q8H  . vancomycin  750 mg Intravenous Q12H   ZOX:[id number]:acetaminophen **OR** acetaminophen, ondansetron **OR** ondansetron (ZOFRAN) IV  Assessment/Plan: Left facial/submandibular abscess, s/p I&D. Much improved. Consider d/c home on oral antibiotics. Pt is scheduled to follow up with ID. She may follow up with me as needed.   LOS: 3 days   Alexandrina Fiorini,SUI W 02/13/2016, 9:09 AM

## 2016-02-13 NOTE — Progress Notes (Addendum)
PROGRESS NOTE    Hannah Gray  ZOX:[id number] DOB: 05-Mar-1960 DOA: 02/10/2016 PCP: No PCP Per Patient   Brief Narrative:  Patient admitted for submandibular abscess s/p I&D. Seen by ENT and Id. Today her Cr has bumped up.   Assessment & Plan:   Principal Problem:   Neck mass Active Problems:   HTN (hypertension)   Diabetes (HCC)   Abnormal CT scan, neck   Leukocytosis  Complicated left submandibular mass s/p I&D 02/10/16 -ENT and ID input appreciated.  -change to po abx- will do Augmentin 500mg  tid and Doxy 100 mg po bid for 7 days  -advance diet as tolerated  -Analgesics as needed -seen by wound care and showed patient how to do it at home if needed.   Incidental lung findings on CT neck -CT chest with contrast- trace b/l pleural fluid, maybe small septic emboli in setting of her neck abscess and enlarged subcarinal node.    Acute Kidney Injury- unknown etiology  -possible septic emboli?? -will repeat her Cr now to confirm, if elevated she will need to get work up for possible septic emboli. In that case will get echo and renal US, meanwhile restart IV Abx.   HTN -resume home meds    DM2 -Sliding scale insulin coverage AC/HS for now. -A1c- in process   UPDATE AT 5PM Patient is confirmed to have elevated Cr. Will order Renal US and echo to rule out any septic embolic/renal infarct and other pathology. Restart IV Abx, Cultures are neg. Will determine further plan depending on the results.    DVT prophylaxis: SCDs.  Encourage ambulation. Code Status: FULL Family Communication: Patient is able to comprehend as a Nurse, learning disability was used.  Disposition Plan: Pending AKI work up.  Consults called: ENT and ID  Consultants:   ENT  ID  Procedures:   I&D 1/22  Antimicrobials:   Augmentin and Doxy    Subjective: Used the interpreter - Wyvonnia Dusky Patient is able to eat and drink well without any complaints. She feels much better today and  remains afebrile.  Objective: Vitals:   02/12/16 1658 02/12/16 2107 02/13/16 0515 02/13/16 0941  BP: 109/70 98/70 109/68 114/69  Pulse: 70 (!) 56 (!) 55 (!) 57  Resp: 17 18 19 18   Temp: 98.5 F (36.9 C) 98.6 F (37 C) 98.3 F (36.8 C) 98.7 F (37.1 C)  TempSrc: Oral Oral Oral Oral  SpO2: 98% 99% 100% 100%    Intake/Output Summary (Last 24 hours) at 02/13/16 1035 Last data filed at 02/13/16 0600  Gross per 24 hour  Intake          3288.34 ml  Output                2 ml  Net          3286.34 ml   There were no vitals filed for this visit.  Examination:  General exam: Appears calm and comfortable; significant swelling of the left jaw with erythema (dressing in place) tender to touch. About 10 cm in size.  Respiratory system: Clear to auscultation. Respiratory effort normal. Cardiovascular system: S1 & S2 heard, RRR. No JVD, murmurs, rubs, gallops or clicks. No pedal edema. Gastrointestinal system: Abdomen is nondistended, soft and nontender. No organomegaly or masses felt. Normal bowel sounds heard. Central nervous system: Alert and oriented. No focal neurological deficits. Extremities: Symmetric 5 x 5 power. Skin: erythema of left jaw Psychiatry: Judgement and insight appear normal. Mood & affect appropriate.  Data Reviewed:   CBC:  Recent Labs Lab 02/10/16 1141 02/11/16 0315  WBC 13.7* 10.7*  NEUTROABS 10.2*  --   HGB 14.8 13.2  HCT 43.5 39.4  MCV 86.7 86.4  PLT 287 250   Basic Metabolic Panel:  Recent Labs Lab 02/10/16 1141 02/11/16 0315 02/13/16 0454  NA 135 133* 141  K 3.7 3.3* 4.1  CL 99* 103 106  CO2 25 24 28   GLUCOSE 314* 215* 186*  BUN 9 9 13   CREATININE 0.80 0.58 1.81*  CALCIUM 9.9 8.8* 8.9   GFR: CrCl cannot be calculated (Unknown ideal weight.). Liver Function Tests:  Recent Labs Lab 02/10/16 1141  AST 25  ALT 22  ALKPHOS 123  BILITOT 0.7  PROT 7.7  ALBUMIN 3.2*   No results for input(s): LIPASE, AMYLASE in the last 168  hours. No results for input(s): AMMONIA in the last 168 hours. Coagulation Profile:  Recent Labs Lab 02/11/16 0315  INR 1.23   Cardiac Enzymes: No results for input(s): CKTOTAL, CKMB, CKMBINDEX, TROPONINI in the last 168 hours. BNP (last 3 results) No results for input(s): PROBNP in the last 8760 hours. HbA1C: No results for input(s): HGBA1C in the last 72 hours. CBG:  Recent Labs Lab 02/12/16 0801 02/12/16 1216 02/12/16 1634 02/12/16 2105 02/13/16 0746  GLUCAP 168* 160* 169* 183* 172*   Lipid Profile: No results for input(s): CHOL, HDL, LDLCALC, TRIG, CHOLHDL, LDLDIRECT in the last 72 hours. Thyroid Function Tests: No results for input(s): TSH, T4TOTAL, FREET4, T3FREE, THYROIDAB in the last 72 hours. Anemia Panel: No results for input(s): VITAMINB12, FOLATE, FERRITIN, TIBC, IRON, RETICCTPCT in the last 72 hours. Sepsis Labs:  Recent Labs Lab 02/10/16 1204  LATICACIDVEN 1.39    Recent Results (from the past 240 hour(s))  Blood culture (routine x 2)     Status: None (Preliminary result)   Collection Time: 02/10/16  7:53 PM  Result Value Ref Range Status   Specimen Description BLOOD RIGHT HAND  Final   Special Requests BOTTLES DRAWN AEROBIC AND ANAEROBIC 3CC  Final   Culture NO GROWTH 2 DAYS  Final   Report Status PENDING  Incomplete  Blood culture (routine x 2)     Status: None (Preliminary result)   Collection Time: 02/10/16  8:36 PM  Result Value Ref Range Status   Specimen Description BLOOD RIGHT HAND  Final   Special Requests BOTTLES DRAWN AEROBIC AND ANAEROBIC 5CC  Final   Culture NO GROWTH 2 DAYS  Final   Report Status PENDING  Incomplete  Aerobic/Anaerobic Culture (surgical/deep wound)     Status: None (Preliminary result)   Collection Time: 02/11/16  8:40 AM  Result Value Ref Range Status   Specimen Description ABSCESS LEFT FACE  Final   Special Requests NONE  Final   Gram Stain   Final    MODERATE WBC PRESENT, PREDOMINANTLY PMN ABUNDANT GRAM  POSITIVE COCCI IN PAIRS ABUNDANT GRAM NEGATIVE COCCOBACILLI FEW GRAM VARIABLE ROD    Culture NO GROWTH 1 DAY  Final   Report Status PENDING  Incomplete         Radiology Studies: Ct Chest W Contrast  Result Date: 02/11/2016 CLINICAL DATA:  Lung consolidation noted on CT of the neck. Further evaluation requested. Initial encounter. EXAM: CT CHEST WITH CONTRAST TECHNIQUE: Multidetector CT imaging of the chest was performed during intravenous contrast administration. CONTRAST:  75 mL ISOVUE-300 IOPAMIDOL (ISOVUE-300) INJECTION 61% COMPARISON:  CT of the neck performed 02/10/2016 FINDINGS: Cardiovascular: The heart is normal in  size. The thoracic aorta is grossly unremarkable. The great vessels are within normal limits. Mediastinum/Nodes: A mildly enlarged 1.1 cm subcarinal node is seen. Remaining visualized mediastinal nodes are normal in size. No pericardial effusion is identified. The visualized portions of the thyroid gland are unremarkable. No axillary lymphadenopathy is seen. Lungs/Pleura: Trace bilateral pleural fluid is noted. A calcified granuloma is noted at the right midlung zone. Mild peripheral wedge-shaped airspace opacities within both upper lobes may reflect small septic emboli, especially given the patient's large neck abscess. Mild bibasilar atelectasis is seen. No dominant mass is identified. Upper Abdomen: The visualized portions of the liver and spleen are unremarkable. The visualized portions of the gallbladder, pancreas, adrenal glands and left kidney are within normal limits, aside from prominent scarring at the right kidney. Musculoskeletal: No acute osseous abnormalities are identified. The visualized musculature is unremarkable in appearance. IMPRESSION: 1. Trace bilateral pleural fluid, with mild bibasilar atelectasis. Mild peripheral wedge-shaped airspace opacities within both upper lung lobes may reflect small septic emboli, especially given the patient's large neck abscess.  No dominant mass seen. 2. Mildly enlarged 1.1 cm subcarinal node may reflect the underlying acute infection. 3. Scarring at the right kidney. Electronically Signed   By: Roanna Raider M.D.   On: 02/11/2016 19:18        Scheduled Meds: . feeding supplement (GLUCERNA SHAKE)  237 mL Oral TID BM  . insulin aspart  0-20 Units Subcutaneous TID WC  . insulin aspart  0-5 Units Subcutaneous QHS  . piperacillin-tazobactam (ZOSYN)  IV  3.375 g Intravenous Q8H  . vancomycin  750 mg Intravenous Q12H   Continuous Infusions: . sodium chloride 100 mL/hr at 02/10/16 2316     LOS: 3 days    Time spent: 35 mins     Yohan Samons Joline Maxcy, MD [facility] Pager [phone]   If 7PM-7AM, please contact night-coverage [email or web address] Password [facility] 02/13/2016, 10:35 AM

## 2016-02-13 NOTE — Discharge Summary (Signed)
Physician Discharge Summary  Hannah Gray ZOX:[id number] DOB: [birth year] DOA: 02/10/2016  PCP: No PCP Per Patient  Admit date: 02/10/2016 Discharge date: 02/13/2016  Admitted From: Home Disposition:  Home  Recommendations for Outpatient Follow-up:  1. Follow up with PCP in 1-2 weeks 2. Follow up with Dr Luciana Axe from Infectious Disease within a week. Appointment to be made by him per his note  3. Follow up with ENT as needed   Home Health: None Equipment/Devices: No  Discharge Condition: stable CODE STATUS: full  Diet recommendation: 2g Sodium Diet   Brief/Interim Summary: Hannah Gray is a 56 y.o. woman with a history of HTN and DM who is visiting her son, from [place].  She speaks Spanish, and this history and physical was completed with video interpreter assistance [phone]).  The patient reports that she has had five days of progressive swelling just below her left jaw.  She denies any bites or new exposures.  She denies any tooth pain or known history of tooth abscesses.  No fever, no shortness of breath.  She has difficulty opening mouth due to the swelling, so she has been on a liquid diet.  She denies signs or symptoms of aspiration.  She reports night sweats and a weight loss of 20 kilos in the past three months. CT of her neck showed multiloculated submandibular mass on the left with concerns of ruptured abscess. I&D was performed and was started on IV Abx. Over the course of next couple of days her symptoms significantly improved and remained Afebrile. Infectious Disease and ENT were consulted for assistance with the management. It was determined she can be discharged home with outpatient follow as noted.  During her stay, Interpreter was used by me for the communication process and she expressed clear understanding.  CT chest with contrast also showed septic emboli likely from her neck mass. I have explained her if she spikes fevers at home or feels very sick she will  need to notify her PCP or comes to the ER right away to get evaluated for possible endocarditis and echocardiolgram.  Currently stable to be discharged.   Discharge Diagnoses:  Principal Problem:   Neck mass Active Problems:   HTN (hypertension)   Diabetes (HCC)   Abnormal CT scan, neck   Leukocytosis  Complicated left submandibular mass s/p I&D 02/10/16 -ENT and ID input appreciated.  -change to po abx- will do Augmentin 500mg  tid and Doxy 100 mg po bid for 7 days  -advance diet well tolerated  -Analgesics as needed -seen by wound care and showed patient how to do it at home if needed.   Incidental lung findings on CT neck -CT chest with contrast- trace b/l pleural fluid, maybe small septic emboli in setting of her neck abscess and enlarged subcarinal node.  -I have explained her if she becomes febrile despite of being on Abx, she needs to let her PCP know or come to the Ed. At that point she may need to get an echo to evaluate for any endocarditis.   HTN -resume home meds. Some medication from [place] that she remember the name.     DM2 -resume home meds    Discharge Instructions   Allergies as of 02/13/2016   No Known Allergies     Medication List    TAKE these medications   amoxicillin-clavulanate 500-125 MG tablet Commonly known as:  AUGMENTIN Take 1 tablet (500 mg total) by mouth 3 (three) times daily.   doxycycline 100 MG tablet  Commonly known as:  VIBRA-TABS Take 1 tablet (100 mg total) by mouth every 12 (twelve) hours.   OVER THE COUNTER MEDICATION Take 1 tablet by mouth daily.   OVER THE COUNTER MEDICATION Take 1 tablet by mouth daily.       No Known Allergies  Consultations:  ENT  ID  Wound care team   Procedures/Studies: Ct Soft Tissue Neck W Contrast  Result Date: 02/10/2016 CLINICAL DATA:  LEFT facial swelling for 5 days. Evaluate neck abscess. History of diabetes, hypertension. EXAM: CT NECK WITH CONTRAST TECHNIQUE: Multidetector  CT imaging of the neck was performed using the standard protocol following the bolus administration of intravenous contrast. CONTRAST:  75mL ISOVUE-300 IOPAMIDOL (ISOVUE-300) INJECTION 61% COMPARISON:  None. FINDINGS: Pharynx and larynx: Trace retropharyngeal effusion, pharynx and larynx are otherwise unremarkable. Salivary glands: Multiloculated 6.2 x 4.6 x 3.4 cm LEFT submandibular irregular cystic peripherally enhancing mass, discontinuous along the lateral margin most consistent with rupture. Hyperemic LEFT submandibular parenchyma without sialolith or definite ductal dilatation. Contiguous extension into the LEFT masseter muscle. Fat stranding anterior aspect LEFT superficial parotid lobe, otherwise unremarkable. Thyroid: Normal. Lymph nodes: Greater than expected number of non pathologically enlarged and may be reactive however, rounded morphology LEFT level 5 B lymph node. Vascular: Trace calcific atherosclerosis of the aortic arch. Limited intracranial: Normal. Visualized orbits: Normal. Mastoids and visualized paranasal sinuses: LEFT maxillary mucosal retention cyst. No paranasal sinus air-fluid levels. Mastoid air cells are well aerated. Skeleton: Mild degenerative changes cervical spine. No acute osseous process. Poor dentition with multiple periapical abscess. Upper chest: Consolidation with central air RIGHT posterior upper lobe, periphery of LEFT upper lobe. No superior mediastinal lymphadenopathy. Other: Small LEFT neck effusion. Extensive LEFT neck subcutaneous fat stranding without subcutaneous gas or radiopaque foreign bodies. IMPRESSION: 6.2 x 4.6 x 3.4 cm ruptured LEFT submandibular abscess with LEFT masseter muscle extension. Greater than expected number of non pathologically lymph nodes. The findings may represent simple infection, underlying mass is possible and follow-up is recommended. Dense consolidations and upper lobes concerning for pneumonia, neoplasm not excluded. Recommend CT chest  with contrast. Electronically Signed   By: Awilda Metroourtnay  Bloomer M.D.   On: 02/10/2016 18:03   Ct Chest W Contrast  Result Date: 02/11/2016 CLINICAL DATA:  Lung consolidation noted on CT of the neck. Further evaluation requested. Initial encounter. EXAM: CT CHEST WITH CONTRAST TECHNIQUE: Multidetector CT imaging of the chest was performed during intravenous contrast administration. CONTRAST:  75 mL ISOVUE-300 IOPAMIDOL (ISOVUE-300) INJECTION 61% COMPARISON:  CT of the neck performed 02/10/2016 FINDINGS: Cardiovascular: The heart is normal in size. The thoracic aorta is grossly unremarkable. The great vessels are within normal limits. Mediastinum/Nodes: A mildly enlarged 1.1 cm subcarinal node is seen. Remaining visualized mediastinal nodes are normal in size. No pericardial effusion is identified. The visualized portions of the thyroid gland are unremarkable. No axillary lymphadenopathy is seen. Lungs/Pleura: Trace bilateral pleural fluid is noted. A calcified granuloma is noted at the right midlung zone. Mild peripheral wedge-shaped airspace opacities within both upper lobes may reflect small septic emboli, especially given the patient's large neck abscess. Mild bibasilar atelectasis is seen. No dominant mass is identified. Upper Abdomen: The visualized portions of the liver and spleen are unremarkable. The visualized portions of the gallbladder, pancreas, adrenal glands and left kidney are within normal limits, aside from prominent scarring at the right kidney. Musculoskeletal: No acute osseous abnormalities are identified. The visualized musculature is unremarkable in appearance. IMPRESSION: 1. Trace bilateral pleural fluid, with mild bibasilar  atelectasis. Mild peripheral wedge-shaped airspace opacities within both upper lung lobes may reflect small septic emboli, especially given the patient's large neck abscess. No dominant mass seen. 2. Mildly enlarged 1.1 cm subcarinal node may reflect the underlying acute  infection. 3. Scarring at the right kidney. Electronically Signed   By: Roanna Raider M.D.   On: 02/11/2016 19:18   Dg Chest Portable 1 View  Result Date: 02/10/2016 CLINICAL DATA:  Known cheek abscess EXAM: PORTABLE CHEST 1 VIEW COMPARISON:  None. FINDINGS: Cardiac shadow is at the upper limits of normal in size but accentuated by the portable technique. The lungs are well aerated bilaterally. No focal infiltrate or sizable effusion is seen. No acute bony abnormality is noted. IMPRESSION: No acute abnormality seen. Electronically Signed   By: Alcide Clever M.D.   On: 02/10/2016 20:34       Subjective:   Discharge Exam: Vitals:   02/13/16 0515 02/13/16 0941  BP: 109/68 114/69  Pulse: (!) 55 (!) 57  Resp: 19 18  Temp: 98.3 F (36.8 C) 98.7 F (37.1 C)   Vitals:   02/12/16 1658 02/12/16 2107 02/13/16 0515 02/13/16 0941  BP: 109/70 98/70 109/68 114/69  Pulse: 70 (!) 56 (!) 55 (!) 57  Resp: 17 18 19 18   Temp: 98.5 F (36.9 C) 98.6 F (37 C) 98.3 F (36.8 C) 98.7 F (37.1 C)  TempSrc: Oral Oral Oral Oral  SpO2: 98% 99% 100% 100%    General: Pt is alert, awake, not in acute distress Cardiovascular: RRR, S1/S2 +, no rubs, no gallops Respiratory: CTA bilaterally, no wheezing, no rhonchi Abdominal: Soft, NT, ND, bowel sounds + Extremities: no edema, no cyanosis    The results of significant diagnostics from this hospitalization (including imaging, microbiology, ancillary and laboratory) are listed below for reference.     Microbiology: Recent Results (from the past 240 hour(s))  Blood culture (routine x 2)     Status: None (Preliminary result)   Collection Time: 02/10/16  7:53 PM  Result Value Ref Range Status   Specimen Description BLOOD RIGHT HAND  Final   Special Requests BOTTLES DRAWN AEROBIC AND ANAEROBIC 3CC  Final   Culture NO GROWTH 2 DAYS  Final   Report Status PENDING  Incomplete  Blood culture (routine x 2)     Status: None (Preliminary result)   Collection  Time: 02/10/16  8:36 PM  Result Value Ref Range Status   Specimen Description BLOOD RIGHT HAND  Final   Special Requests BOTTLES DRAWN AEROBIC AND ANAEROBIC 5CC  Final   Culture NO GROWTH 2 DAYS  Final   Report Status PENDING  Incomplete  Aerobic/Anaerobic Culture (surgical/deep wound)     Status: None (Preliminary result)   Collection Time: 02/11/16  8:40 AM  Result Value Ref Range Status   Specimen Description ABSCESS LEFT FACE  Final   Special Requests NONE  Final   Gram Stain   Final    MODERATE WBC PRESENT, PREDOMINANTLY PMN ABUNDANT GRAM POSITIVE COCCI IN PAIRS ABUNDANT GRAM NEGATIVE COCCOBACILLI FEW GRAM VARIABLE ROD    Culture NO GROWTH 1 DAY  Final   Report Status PENDING  Incomplete     Labs: BNP (last 3 results) No results for input(s): BNP in the last 8760 hours. Basic Metabolic Panel:  Recent Labs Lab 02/10/16 1141 02/11/16 0315 02/13/16 0454  NA 135 133* 141  K 3.7 3.3* 4.1  CL 99* 103 106  CO2 25 24 28   GLUCOSE 314* 215* 186*  BUN 9 9 13   CREATININE 0.80 0.58 1.81*  CALCIUM 9.9 8.8* 8.9   Liver Function Tests:  Recent Labs Lab 02/10/16 1141  AST 25  ALT 22  ALKPHOS 123  BILITOT 0.7  PROT 7.7  ALBUMIN 3.2*   No results for input(s): LIPASE, AMYLASE in the last 168 hours. No results for input(s): AMMONIA in the last 168 hours. CBC:  Recent Labs Lab 02/10/16 1141 02/11/16 0315  WBC 13.7* 10.7*  NEUTROABS 10.2*  --   HGB 14.8 13.2  HCT 43.5 39.4  MCV 86.7 86.4  PLT 287 250   Cardiac Enzymes: No results for input(s): CKTOTAL, CKMB, CKMBINDEX, TROPONINI in the last 168 hours. BNP: Invalid input(s): POCBNP CBG:  Recent Labs Lab 02/12/16 0801 02/12/16 1216 02/12/16 1634 02/12/16 2105 02/13/16 0746  GLUCAP 168* 160* 169* 183* 172*   D-Dimer No results for input(s): DDIMER in the last 72 hours. Hgb A1c No results for input(s): HGBA1C in the last 72 hours. Lipid Profile No results for input(s): CHOL, HDL, LDLCALC, TRIG,  CHOLHDL, LDLDIRECT in the last 72 hours. Thyroid function studies No results for input(s): TSH, T4TOTAL, T3FREE, THYROIDAB in the last 72 hours.  Invalid input(s): FREET3 Anemia work up No results for input(s): VITAMINB12, FOLATE, FERRITIN, TIBC, IRON, RETICCTPCT in the last 72 hours. Urinalysis No results found for: COLORURINE, APPEARANCEUR, LABSPEC, PHURINE, GLUCOSEU, HGBUR, BILIRUBINUR, KETONESUR, PROTEINUR, UROBILINOGEN, NITRITE, LEUKOCYTESUR Sepsis Labs Invalid input(s): PROCALCITONIN,  WBC,  LACTICIDVEN Microbiology Recent Results (from the past 240 hour(s))  Blood culture (routine x 2)     Status: None (Preliminary result)   Collection Time: 02/10/16  7:53 PM  Result Value Ref Range Status   Specimen Description BLOOD RIGHT HAND  Final   Special Requests BOTTLES DRAWN AEROBIC AND ANAEROBIC 3CC  Final   Culture NO GROWTH 2 DAYS  Final   Report Status PENDING  Incomplete  Blood culture (routine x 2)     Status: None (Preliminary result)   Collection Time: 02/10/16  8:36 PM  Result Value Ref Range Status   Specimen Description BLOOD RIGHT HAND  Final   Special Requests BOTTLES DRAWN AEROBIC AND ANAEROBIC 5CC  Final   Culture NO GROWTH 2 DAYS  Final   Report Status PENDING  Incomplete  Aerobic/Anaerobic Culture (surgical/deep wound)     Status: None (Preliminary result)   Collection Time: 02/11/16  8:40 AM  Result Value Ref Range Status   Specimen Description ABSCESS LEFT FACE  Final   Special Requests NONE  Final   Gram Stain   Final    MODERATE WBC PRESENT, PREDOMINANTLY PMN ABUNDANT GRAM POSITIVE COCCI IN PAIRS ABUNDANT GRAM NEGATIVE COCCOBACILLI FEW GRAM VARIABLE ROD    Culture NO GROWTH 1 DAY  Final   Report Status PENDING  Incomplete     Time coordinating discharge: Over 30 minutes  SIGNED:   Dimple Nanas, MD  [facility] 02/13/2016, 10:40 AM Pager   If 7PM-7AM, please contact night-coverage [email or web address] Password [email or web address]

## 2016-02-13 NOTE — Progress Notes (Signed)
Amin, MD stated that he would be changing the patients antibiotic to Iv. Will continue to monitor.

## 2016-02-13 NOTE — Progress Notes (Addendum)
Pharmacy Antibiotic Note  Hannah Gray is a 56 y.o. female admitted on 02/10/2016 with submandibular infxn/abscess, sepsis.  Pharmacy has been consulted for vancomycin/zosyn dosing. Was to be d/c'd today on augmentin and doxy, but MD now changing antibiotics back to IV. Pt is afebrile and WBC is mildly elevated at 10.7. Scr with significant bump, up 0.58>1.76, CrCl~40 using estimated weight of ~70kg.  Last dose of vancomycin 750mg  IV q12h was this AM at 0430. Last dose of Zosyn was at 0554 this AM.   Plan: Check vancomycin random level to assess clearance with SCr bump Hold vancomycin for now pending level Resume Zosyn 3.375g IV q8h (4h infusion) Monitor clinical progress, c/s, renal function, abx plan/LOT Vancomycin trough as indicated  ADDENDUM:  Vancomycin random tonight is therapeutic at 17. Ok to resume vancomycin  Plan: Vancomycin 1g IV q24h Monitor clinical progress, c/s, renal function, abx plan/LOT Vancomycin trough as indicated     Temp (24hrs), Avg:98.5 F (36.9 C), Min:98.3 F (36.8 C), Max:98.7 F (37.1 C)   Recent Labs Lab 02/10/16 1141 02/10/16 1204 02/11/16 0315 02/13/16 0454 02/13/16 1448  WBC 13.7*  --  10.7*  --   --   CREATININE 0.80  --  0.58 1.81* 1.76*  LATICACIDVEN  --  1.39  --   --   --     CrCl cannot be calculated (Unknown ideal weight.).    No Known Allergies  Antimicrobials this admission: Vanc 1/23>> Zosyn 1/22>> Clinda x 1 1/22 Doxy x1 on 1/25 augmentin x 1 on 1/25  Dose adjustments this admission: N/A  Microbiology results: 1/23 abscess L face: ngtd 1/22 BCx: ngtd  Babs BertinHaley Chesni Vos, PharmD, BCPS Clinical Pharmacist 02/13/2016 5:21 PM

## 2016-02-13 NOTE — Progress Notes (Signed)
Amin, MD stated that since patients Creatinine was 1.81, we would repeat labs and hold off on discharge until the results were back. Repeat creatinine was 1.76. MD notified. Will continue to monitor.

## 2016-02-14 ENCOUNTER — Inpatient Hospital Stay (HOSPITAL_COMMUNITY): Payer: Self-pay

## 2016-02-14 DIAGNOSIS — R7881 Bacteremia: Secondary | ICD-10-CM

## 2016-02-14 DIAGNOSIS — E11638 Type 2 diabetes mellitus with other oral complications: Secondary | ICD-10-CM

## 2016-02-14 LAB — BASIC METABOLIC PANEL
ANION GAP: 7 (ref 5–15)
BUN: 12 mg/dL (ref 6–20)
CHLORIDE: 107 mmol/L (ref 101–111)
CO2: 28 mmol/L (ref 22–32)
Calcium: 9 mg/dL (ref 8.9–10.3)
Creatinine, Ser: 1.64 mg/dL — ABNORMAL HIGH (ref 0.44–1.00)
GFR calc Af Amer: 40 mL/min — ABNORMAL LOW (ref >60)
GFR calc non Af Amer: 34 mL/min — ABNORMAL LOW (ref >60)
GLUCOSE: 132 mg/dL — AB (ref 65–99)
POTASSIUM: 4.2 mmol/L (ref 3.5–5.1)
Sodium: 142 mmol/L (ref 135–145)

## 2016-02-14 LAB — GLUCOSE, CAPILLARY
GLUCOSE-CAPILLARY: 198 mg/dL — AB (ref 65–99)
GLUCOSE-CAPILLARY: 191 mg/dL — AB (ref 65–99)
GLUCOSE-CAPILLARY: 191 mg/dL — AB (ref 65–99)
Glucose-Capillary: 137 mg/dL — ABNORMAL HIGH (ref 65–99)

## 2016-02-14 LAB — ECHOCARDIOGRAM COMPLETE

## 2016-02-14 MED ORDER — AMOXICILLIN-POT CLAVULANATE 875-125 MG PO TABS
1.0000 | ORAL_TABLET | Freq: Two times a day (BID) | ORAL | Status: DC
  Administered 2016-02-14 – 2016-02-16 (×4): 1 via ORAL
  Filled 2016-02-14 (×4): qty 1

## 2016-02-14 MED ORDER — POLYETHYLENE GLYCOL 3350 17 G PO PACK
17.0000 g | PACK | Freq: Every day | ORAL | Status: DC
  Administered 2016-02-14 – 2016-02-16 (×3): 17 g via ORAL
  Filled 2016-02-14 (×3): qty 1

## 2016-02-14 MED ORDER — DOXYCYCLINE HYCLATE 100 MG PO TABS
100.0000 mg | ORAL_TABLET | Freq: Two times a day (BID) | ORAL | Status: DC
  Administered 2016-02-14 – 2016-02-16 (×4): 100 mg via ORAL
  Filled 2016-02-14 (×4): qty 1

## 2016-02-14 NOTE — Progress Notes (Signed)
PROGRESS NOTE    [name]  ZOX:[id number] DOB: 02-14-1960 DOA: 02/10/2016 PCP: No PCP Per Patient   Brief Narrative:  Patient admitted for submandibular abscess s/p I&D. Seen by ENT and Id. Was to be discharged but CR increased 3x/normal.    Assessment & Plan:   Principal Problem:   Neck mass Active Problems:   HTN (hypertension)   Diabetes (HCC)   Abnormal CT scan, neck   Leukocytosis  Complicated left submandibular mass s/p I&D 02/10/16 -ENT and ID input appreciated.  -change to po abx- will do Augmentin 500mg  tid and Doxy 100 mg po bid for 7 days  -advance diet as tolerated  -Analgesics as needed -seen by wound care and showed patient how to do it at home if needed.   Incidental lung findings on CT neck -CT chest with contrast- trace b/l pleural fluid, maybe small septic emboli in setting of her neck abscess and enlarged subcarinal node.    Acute Kidney Injury- unknown etiology  -renal U/S normal -IVF -recheck in AM -? Volume dilution  DM2 -Sliding scale insulin coverage AC/HS for now. -A1c- 14! -asked diabetic coordinator to see -if Cr normalizes, maybe PO metformin?  .    DVT prophylaxis: SCDs.  Encourage ambulation. Code Status: FULL Family Communication: seen with Graciella Disposition Plan: home in AM if Cr improved Consults called: ENT and ID  Consultants:   ENT  ID  Procedures:   I&D 1/22  Antimicrobials:   Augmentin and Doxy    Subjective: Ready to go back to [place]  Objective: Vitals:   02/13/16 1718 02/13/16 2300 02/14/16 0452 02/14/16 1005  BP: 117/74 110/68 113/63 104/64  Pulse: (!) 57 (!) 57 (!) 54 72  Resp: 18 18 18 17   Temp: 99.2 F (37.3 C) 98.6 F (37 C) 98.7 F (37.1 C) 98.4 F (36.9 C)  TempSrc: Oral Oral Oral Oral  SpO2: 100% 100% 97% 94%    Intake/Output Summary (Last 24 hours) at 02/14/16 1222 Last data filed at 02/14/16 1015  Gross per 24 hour  Intake             4140 ml  Output                 0 ml  Net             4140 ml   There were no vitals filed for this visit.  Examination:  General exam: Appears calm and comfortable; decreased swelling of the left jaw with erythema (dressing in place) tender to touch.   Respiratory system: Clear to auscultation. Respiratory effort normal. Cardiovascular system: S1 & S2 heard, RRR. No JVD, murmurs, rubs, gallops or clicks. No pedal edema. Gastrointestinal system: Abdomen is nondistended, soft and nontender. No organomegaly or masses felt. Normal bowel sounds heard. Central nervous system: Alert and oriented. No focal neurological deficits.      Data Reviewed:   CBC:  Recent Labs Lab 02/10/16 1141 02/11/16 0315  WBC 13.7* 10.7*  NEUTROABS 10.2*  --   HGB 14.8 13.2  HCT 43.5 39.4  MCV 86.7 86.4  PLT 287 250   Basic Metabolic Panel:  Recent Labs Lab 02/10/16 1141 02/11/16 0315 02/13/16 0454 02/13/16 1448 02/14/16 0544  NA 135 133* 141 141 142  K 3.7 3.3* 4.1 3.7 4.2  CL 99* 103 106 106 107  CO2 25 24 28 29 28   GLUCOSE 314* 215* 186* 149* 132*  BUN 9 9 13 13 12   CREATININE 0.80 0.58  1.81* 1.76* 1.64*  CALCIUM 9.9 8.8* 8.9 8.7* 9.0   GFR: CrCl cannot be calculated (Unknown ideal weight.). Liver Function Tests:  Recent Labs Lab 02/10/16 1141  AST 25  ALT 22  ALKPHOS 123  BILITOT 0.7  PROT 7.7  ALBUMIN 3.2*   No results for input(s): LIPASE, AMYLASE in the last 168 hours. No results for input(s): AMMONIA in the last 168 hours. Coagulation Profile:  Recent Labs Lab 02/11/16 0315  INR 1.23   Cardiac Enzymes: No results for input(s): CKTOTAL, CKMB, CKMBINDEX, TROPONINI in the last 168 hours. BNP (last 3 results) No results for input(s): PROBNP in the last 8760 hours. HbA1C:  Recent Labs  02/12/16 0655  HGBA1C 14.0*   CBG:  Recent Labs Lab 02/13/16 1148 02/13/16 1717 02/13/16 2021 02/14/16 0820 02/14/16 1140  GLUCAP 231* 175* 185* 137* 198*   Lipid Profile: No results for  input(s): CHOL, HDL, LDLCALC, TRIG, CHOLHDL, LDLDIRECT in the last 72 hours. Thyroid Function Tests: No results for input(s): TSH, T4TOTAL, FREET4, T3FREE, THYROIDAB in the last 72 hours. Anemia Panel: No results for input(s): VITAMINB12, FOLATE, FERRITIN, TIBC, IRON, RETICCTPCT in the last 72 hours. Sepsis Labs:  Recent Labs Lab 02/10/16 1204  LATICACIDVEN 1.39    Recent Results (from the past 240 hour(s))  Blood culture (routine x 2)     Status: None (Preliminary result)   Collection Time: 02/10/16  7:53 PM  Result Value Ref Range Status   Specimen Description BLOOD RIGHT HAND  Final   Special Requests BOTTLES DRAWN AEROBIC AND ANAEROBIC 3CC  Final   Culture NO GROWTH 4 DAYS  Final   Report Status PENDING  Incomplete  Blood culture (routine x 2)     Status: None (Preliminary result)   Collection Time: 02/10/16  8:36 PM  Result Value Ref Range Status   Specimen Description BLOOD RIGHT HAND  Final   Special Requests BOTTLES DRAWN AEROBIC AND ANAEROBIC 5CC  Final   Culture NO GROWTH 4 DAYS  Final   Report Status PENDING  Incomplete  Aerobic/Anaerobic Culture (surgical/deep wound)     Status: None (Preliminary result)   Collection Time: 02/11/16  8:40 AM  Result Value Ref Range Status   Specimen Description ABSCESS LEFT FACE  Final   Special Requests NONE  Final   Gram Stain   Final    MODERATE WBC PRESENT, PREDOMINANTLY PMN ABUNDANT GRAM POSITIVE COCCI IN PAIRS ABUNDANT GRAM NEGATIVE COCCOBACILLI FEW GRAM VARIABLE ROD    Culture HOLDING FOR POSSIBLE ANAEROBE  Final   Report Status PENDING  Incomplete         Radiology Studies: US Renal  Result Date: 02/14/2016 CLINICAL DATA:  Initial evaluation for acute renal failure. EXAM: RENAL / URINARY TRACT ULTRASOUND COMPLETE COMPARISON:  None. FINDINGS: Right Kidney: Length: 11.2 cm. Echogenicity within normal limits. No mass or hydronephrosis visualized. Left Kidney: Length: 12.7 cm. Echogenicity within normal limits. No mass  or hydronephrosis visualized. Bladder: Appears normal for degree of bladder distention. IMPRESSION: Normal renal ultrasound.  No hydronephrosis. Electronically Signed   By: Rise Mu M.D.   On: 02/14/2016 01:14        Scheduled Meds: . amoxicillin-clavulanate  1 tablet Oral Q12H  . doxycycline  100 mg Oral Q12H  . feeding supplement (GLUCERNA SHAKE)  237 mL Oral TID BM  . insulin aspart  0-20 Units Subcutaneous TID WC  . insulin aspart  0-5 Units Subcutaneous QHS  . polyethylene glycol  17 g Oral Daily  Continuous Infusions: . sodium chloride 100 mL/hr at 02/13/16 1343     LOS: 4 days    Time spent: 25 mins     Grisela Mesch U Kosta Schnitzler, DO [facility] Pager [phone]  If 7PM-7AM, please contact night-coverage [email or web address] Password [facility] 02/14/2016, 12:22 PM

## 2016-02-14 NOTE — Plan of Care (Signed)
Problem: Food- and Nutrition-Related Knowledge Deficit (NB-1.1) Goal: Nutrition education Formal process to instruct or train a patient/client in a skill or to impart knowledge to help patients/clients voluntarily manage or modify food choices and eating behavior to maintain or improve health. Outcome: Completed/Met Date Met: 02/14/16  RD consulted for nutrition education regarding diabetes.   Lab Results  Component Value Date   HGBA1C 14.0 (H) 02/12/2016    RD provided "Carbohydrate Counting for People with Diabetes" handout (in Spanish language) from the Academy of Nutrition and Dietetics. Translator, [place], used. Discussed different food groups and their effects on blood sugar, emphasizing carbohydrate-containing foods. Provided list of carbohydrates and recommended serving sizes of common foods.  Discussed importance of controlled and consistent carbohydrate intake throughout the day. Provided examples of ways to balance meals/snacks and encouraged intake of high-fiber, whole grain complex carbohydrates. Diabetic friendly drink options discussed.Teach back method used.  Expect good compliance.  Corrin Parker, MS, RD, LDN Pager # [phone] After hours/ weekend pager # [phone]

## 2016-02-14 NOTE — Progress Notes (Signed)
Interpreter Graciela Namihira for DR Vann 

## 2016-02-14 NOTE — Progress Notes (Signed)
  Echocardiogram 2D Echocardiogram has been performed.  Hannah Gray, Hannah Gray 02/14/2016, 11:39 AM

## 2016-02-14 NOTE — Progress Notes (Signed)
Nutrition Follow-up  DOCUMENTATION CODES:   Not applicable  INTERVENTION:  Continue Glucerna Shake po TID, each supplement provides 220 kcal and 10 grams of protein  Encourage adequate PO intake.   Diabetic diet education given.  NUTRITION DIAGNOSIS:   Inadequate oral intake related to  (neck mass) as evidenced by per patient/family report; improving  GOAL:   Patient will meet greater than or equal to 90% of their needs; met  MONITOR:   PO intake, Supplement acceptance, Labs, Weight trends, Skin, I & O's  REASON FOR ASSESSMENT:   Malnutrition Screening Tool    ASSESSMENT:   56 y.o. woman with a history of HTN and DM who is visiting her son, from [place]. The patient reports that she has had five days of progressive swelling just below her left jaw.  She denies any bites or new exposures.  She denies any tooth pain or known history of tooth abscesses.  No fever, no shortness of breath.  She has difficulty opening mouth due to the swelling, so she has been on a liquid diet.  She denies signs or symptoms of aspiration.  She reports night sweats and a weight loss of 20 kilos in the past three months.  Meal completion has been 80-100% today. Translator, [place], used. Swallowing and chewing has improved. Pt reports having a decreased appetite over the past 2 week PTA. She reports mostly only consuming liquids, such juice and water. Pt additionally reports she was able to consume yogurt. Usual body weight unknown to the pt. Pt currently has Glucerna Shake ordered and has been consuming them. RD to continue with current orders. Noted pt with new DM dx. Diabetic diet education given.   Diet Order:  Diet Carb Modified Fluid consistency: Thin; Room service appropriate? Yes  Skin:  Wound (see comment) (Incision on throat)  Last BM:  1/25  Height:   Ht Readings from Last 1 Encounters:  No data found for Ht    Weight:   Wt Readings from Last 1 Encounters:  No data found for Wt   02/12/16 Bed Scale revealed 171 lbs (77.7 kg)  Ideal Body Weight:   N/A  BMI:  There is no height or weight on file to calculate BMI.  Estimated Nutritional Needs:   Kcal:  1800-2000  Protein:  75-85 grams  Fluid:  1.8 - 2 L/day  EDUCATION NEEDS:   No education needs identified at this time  Corrin Parker, MS, RD, LDN Pager # [phone] After hours/ weekend pager # [phone]

## 2016-02-14 NOTE — Progress Notes (Signed)
Interpreter Wyvonnia DuskyGraciela Namihira for Nutritionist.

## 2016-02-14 NOTE — Progress Notes (Signed)
Patient had 535 ml of urine in her bladder during bladder scan. After urination, post void residual was 111 ml during bladder scan. Will continue to monitor.

## 2016-02-14 NOTE — Progress Notes (Addendum)
Inpatient Diabetes Program Recommendations  AACE/ADA: New Consensus Statement on Inpatient Glycemic Control (2015)  Target Ranges:  Prepandial:   less than 140 mg/dL      Peak postprandial:   less than 180 mg/dL (1-2 hours)      Critically ill patients:  140 - 180 mg/dL   Results for Hannah Gray, Hannah Gray (MRN [id number]) as of 02/14/2016 13:44  Ref. Range 02/14/2016 08:20 02/14/2016 11:40  Glucose-Capillary Latest Ref Range: 65 - 99 mg/dL 409137 (H) 811198 (H)   Results for Hannah Gray, Hannah Gray (MRN [id number]) as of 02/14/2016 13:44  Ref. Range 02/12/2016 06:55  Hemoglobin A1C Latest Ref Range: 4.8 - 5.6 % 14.0 (H)     Admit with: Left facial/submandibular abscess  Current Insulin Orders: Novolog Resistant Correction Scale/ SSI (0-20 units) TID AC + HS       Spoke with patient today with the aid of Graciela, interpreter.  Patient stated she does not have a history of DM.  No family with diabetes that she knows of.  Patient told me she was going to a free clinic in [place] and that the doctors there never told her that she had any problems with her blood sugars.  Arrived from [place] and came straight to the hospital with the above issues.  Is here from [place] visiting her son.  Discussed A1C results with patient and explained what an A1C is, basic pathophysiology of DM Type 2, basic home care, basic diabetes diet nutrition principles, importance of checking CBGs and maintaining good CBG control to prevent long-term and short-term complications.  Also reviewed blood sugar goals for home.    RNs to provide ongoing basic DM education at bedside with this patient.  Have ordered educational booklet and DM videos.  Have also placed RD consult for DM diet education for this patient.  Gave patient information on purchasing an inexpensive CBG meter and strips at [facility] O[facility] without a Rx.  Meter is $9 and a box of 50 strips is $9.       MD- Note creatinine was normal on admission.  Creatinine has  risen over the last few days.  Unsure why creatinine was rising?    Could the creatinine have risen from any of the antibiotics we have given her?  Note patient plans to return to [place] after her visit with her son.   MD- If creatinine is WNL by time of d/c, recommend giving patient a Rx for Metformin 500 mg BID.  If creatinine is still elevated, could try Amaryl 2 mg daily instead  [facility]       --Will follow patient during hospitalization--  Ambrose FinlandJeannine Johnston Farouk Vivero RN, MSN, CDE Diabetes Coordinator Inpatient Glycemic Control Team Team Pager: [phone] (8a-5p)

## 2016-02-15 DIAGNOSIS — R221 Localized swelling, mass and lump, neck: Secondary | ICD-10-CM

## 2016-02-15 LAB — BASIC METABOLIC PANEL
Anion gap: 7 (ref 5–15)
BUN: 14 mg/dL (ref 6–20)
CO2: 26 mmol/L (ref 22–32)
Calcium: 9 mg/dL (ref 8.9–10.3)
Chloride: 109 mmol/L (ref 101–111)
Creatinine, Ser: 1.44 mg/dL — ABNORMAL HIGH (ref 0.44–1.00)
GFR calc Af Amer: 46 mL/min — ABNORMAL LOW (ref >60)
GFR calc non Af Amer: 40 mL/min — ABNORMAL LOW (ref >60)
Glucose, Bld: 165 mg/dL — ABNORMAL HIGH (ref 65–99)
POTASSIUM: 4.2 mmol/L (ref 3.5–5.1)
SODIUM: 142 mmol/L (ref 135–145)

## 2016-02-15 LAB — GLUCOSE, CAPILLARY
GLUCOSE-CAPILLARY: 167 mg/dL — AB (ref 65–99)
Glucose-Capillary: 195 mg/dL — ABNORMAL HIGH (ref 65–99)
Glucose-Capillary: 224 mg/dL — ABNORMAL HIGH (ref 65–99)
Glucose-Capillary: 111 mg/dL — ABNORMAL HIGH (ref 65–99)

## 2016-02-15 LAB — CULTURE, BLOOD (ROUTINE X 2)
Culture: NO GROWTH
Culture: NO GROWTH

## 2016-02-15 NOTE — Progress Notes (Addendum)
PROGRESS NOTE    Hannah Gray  ZOX:[id number] DOB: [birth year] DOA: 02/10/2016   PCP: Pt has no PCP, visiting from [place]   Brief Narrative:  Patient admitted for submandibular abscess s/p I&D. Seen by ENT and Id. Was to be discharged but CR increased 3x/normal.    Assessment & Plan:  Complicated left submandibular mass s/p I&D 02/10/16 - ENT and ID input appreciated.  - change to po abx- will do Augmentin 500mg  tid and Doxy 100 mg po bid for 7 days  - suspect pt can go home in AM if Cr better   Incidental lung findings on CT neck - CT chest with contrast- trace b/l pleural fluid, maybe small septic emboli in setting of her neck abscess and enlarged subcarinal node.    Acute Kidney Injury- unknown etiology  - renal U/S normal - Cr improving but still not back to baseline - BMP In AM  DM2 - Sliding scale insulin coverage AC/HS for now. - A1c- 14 - diabetic educator recommended Metformin on discharge, I would like to see bit better Cr before discharging on Metformin   DVT prophylaxis: SCDs.  Code Status: FULL Family Communication: pt at bedside  Disposition Plan: home in AM if Cr improved Consults called: ENT and ID  Consultants:   ENT  ID  Procedures:   I&D 1/22  Antimicrobials:   Augmentin and Doxy   Subjective: No concerns this AM.   Objective: Vitals:   02/14/16 1652 02/14/16 2047 02/15/16 0503 02/15/16 0910  BP: 133/78 126/72 130/67 123/79  Pulse: (!) 56 (!) 57 (!) 51 66  Resp: 18 18 18 18   Temp: 99 F (37.2 C) 98.4 F (36.9 C) 98.5 F (36.9 C) 98.2 F (36.8 C)  TempSrc: Oral Oral Oral Oral  SpO2: 95% 97% 97% 98%    Intake/Output Summary (Last 24 hours) at 02/15/16 1420 Last data filed at 02/15/16 0900  Gross per 24 hour  Intake             3640 ml  Output              535 ml  Net             3105 ml   There were no vitals filed for this visit.  Examination:  General exam: Appears calm and comfortable; decreased swelling of the  left jaw with erythema (dressing in place) tender to touch.   Respiratory system: Clear to auscultation. Respiratory effort normal. Cardiovascular system: S1 & S2 heard, RRR. No JVD, murmurs, rubs, gallops or clicks. No pedal edema. Gastrointestinal system: Abdomen is nondistended, soft and nontender. No organomegaly or masses felt. Normal bowel sounds heard. Central nervous system: Alert and oriented. No focal neurological deficits.  Data Reviewed:   CBC:  Recent Labs Lab 02/10/16 1141 02/11/16 0315  WBC 13.7* 10.7*  NEUTROABS 10.2*  --   HGB 14.8 13.2  HCT 43.5 39.4  MCV 86.7 86.4  PLT 287 250   Basic Metabolic Panel:  Recent Labs Lab 02/11/16 0315 02/13/16 0454 02/13/16 1448 02/14/16 0544 02/15/16 0528  NA 133* 141 141 142 142  K 3.3* 4.1 3.7 4.2 4.2  CL 103 106 106 107 109  CO2 24 28 29 28 26   GLUCOSE 215* 186* 149* 132* 165*  BUN 9 13 13 12 14   CREATININE 0.58 1.81* 1.76* 1.64* 1.44*  CALCIUM 8.8* 8.9 8.7* 9.0 9.0   Liver Function Tests:  Recent Labs Lab 02/10/16 1141  AST 25  ALT  22  ALKPHOS 123  BILITOT 0.7  PROT 7.7  ALBUMIN 3.2*   Coagulation Profile:  Recent Labs Lab 02/11/16 0315  INR 1.23   CBG:  Recent Labs Lab 02/14/16 1140 02/14/16 1606 02/14/16 2044 02/15/16 0833 02/15/16 1148  GLUCAP 198* 191* 191* 167* 195*   Sepsis Labs:  Recent Labs Lab 02/10/16 1204  LATICACIDVEN 1.39    Recent Results (from the past 240 hour(s))  Blood culture (routine x 2)     Status: None   Collection Time: 02/10/16  7:53 PM  Result Value Ref Range Status   Specimen Description BLOOD RIGHT HAND  Final   Special Requests BOTTLES DRAWN AEROBIC AND ANAEROBIC 3CC  Final   Culture NO GROWTH 5 DAYS  Final   Report Status 02/15/2016 FINAL  Final  Blood culture (routine x 2)     Status: None   Collection Time: 02/10/16  8:36 PM  Result Value Ref Range Status   Specimen Description BLOOD RIGHT HAND  Final   Special Requests BOTTLES DRAWN AEROBIC  AND ANAEROBIC 5CC  Final   Culture NO GROWTH 5 DAYS  Final   Report Status 02/15/2016 FINAL  Final  Aerobic/Anaerobic Culture (surgical/deep wound)     Status: None (Preliminary result)   Collection Time: 02/11/16  8:40 AM  Result Value Ref Range Status   Specimen Description ABSCESS LEFT FACE  Final   Special Requests NONE  Final   Gram Stain   Final    MODERATE WBC PRESENT, PREDOMINANTLY PMN ABUNDANT GRAM POSITIVE COCCI IN PAIRS ABUNDANT GRAM NEGATIVE COCCOBACILLI FEW GRAM VARIABLE ROD    Culture HOLDING FOR POSSIBLE ANAEROBE  Final   Report Status PENDING  Incomplete     Radiology Studies: US Renal  Result Date: 02/14/2016 CLINICAL DATA:  Initial evaluation for acute renal failure. EXAM: RENAL / URINARY TRACT ULTRASOUND COMPLETE COMPARISON:  None. FINDINGS: Right Kidney: Length: 11.2 cm. Echogenicity within normal limits. No mass or hydronephrosis visualized. Left Kidney: Length: 12.7 cm. Echogenicity within normal limits. No mass or hydronephrosis visualized. Bladder: Appears normal for degree of bladder distention. IMPRESSION: Normal renal ultrasound.  No hydronephrosis. Electronically Signed   By: Rise Mu M.D.   On: 02/14/2016 01:14    Scheduled Meds: . amoxicillin-clavulanate  1 tablet Oral Q12H  . doxycycline  100 mg Oral Q12H  . feeding supplement (GLUCERNA SHAKE)  237 mL Oral TID BM  . insulin aspart  0-20 Units Subcutaneous TID WC  . insulin aspart  0-5 Units Subcutaneous QHS  . polyethylene glycol  17 g Oral Daily   Continuous Infusions: . sodium chloride 100 mL/hr at 02/15/16 0350    LOS: 5 days   Time spent: 25 mins   Debbora Presto, MD [facility] Pager [phone]  If 7PM-7AM, please contact night-coverage [email or web address] Password [email or web address] 02/15/2016, 2:20 PM

## 2016-02-16 LAB — BASIC METABOLIC PANEL
Anion gap: 8 (ref 5–15)
BUN: 16 mg/dL (ref 6–20)
CALCIUM: 9.8 mg/dL (ref 8.9–10.3)
CO2: 30 mmol/L (ref 22–32)
Chloride: 103 mmol/L (ref 101–111)
Creatinine, Ser: 1.45 mg/dL — ABNORMAL HIGH (ref 0.44–1.00)
GFR, EST AFRICAN AMERICAN: 46 mL/min — AB (ref >60)
GFR, EST NON AFRICAN AMERICAN: 40 mL/min — AB (ref >60)
GLUCOSE: 173 mg/dL — AB (ref 65–99)
Potassium: 4.5 mmol/L (ref 3.5–5.1)
Sodium: 141 mmol/L (ref 135–145)

## 2016-02-16 LAB — CBC
HCT: 42.4 % (ref 36.0–46.0)
Hemoglobin: 13.8 g/dL (ref 12.0–15.0)
MCH: 28.8 pg (ref 26.0–34.0)
MCHC: 32.5 g/dL (ref 30.0–36.0)
MCV: 88.3 fL (ref 78.0–100.0)
Platelets: 263 10*3/uL (ref 150–400)
RBC: 4.8 MIL/uL (ref 3.87–5.11)
RDW: 12.5 % (ref 11.5–15.5)
WBC: 10.2 10*3/uL (ref 4.0–10.5)

## 2016-02-16 LAB — GLUCOSE, CAPILLARY
GLUCOSE-CAPILLARY: 178 mg/dL — AB (ref 65–99)
Glucose-Capillary: 250 mg/dL — ABNORMAL HIGH (ref 65–99)

## 2016-02-16 MED ORDER — GLIMEPIRIDE 2 MG PO TABS: 2.0000 mg | ORAL_TABLET | Freq: Every day | ORAL | 1 refills | Status: AC

## 2016-02-16 MED ORDER — DOXYCYCLINE HYCLATE 100 MG PO TABS: 100.0000 mg | ORAL_TABLET | Freq: Two times a day (BID) | ORAL | 0 refills | Status: AC

## 2016-02-16 MED ORDER — AMOXICILLIN-POT CLAVULANATE 500-125 MG PO TABS: 1.0000 | ORAL_TABLET | Freq: Three times a day (TID) | ORAL | 0 refills | Status: AC

## 2016-02-16 NOTE — Discharge Instructions (Signed)
Lesin renal aguda en los adultos (Acute Kidney Injury, Adult) La lesin renal aguda ocurre cuando hay un dao repentino (agudo) en los riones. Un dao pequeo puede no causar problemas, pero un dao mayor puede impedir que los riones funcionen correctamente. La lesin renal aguda puede derivar en una enfermedad renal de larga duracin (crnica). La deteccin temprana y el tratamiento de la lesin renal aguda pueden prevenir que el dao renal se convierta en permanente o empeore. CAUSAS Las causas ms frecuentes de esta afeccin incluyen las siguientes:  Problemas con el flujo sanguneo a los riones. Las [employer] ser las siguientes:  [place].  Enfermedad en el corazn y los vasos sanguneos (cardiovascular).  Quemaduras graves.  Enfermedad heptica.  Lesin [employer] riones. Las causas pueden ser las siguientes:  Algunos medicamentos.  Infeccin renal.  Intoxicaciones.  La cercana o el contacto con sustancias venenosas (txicas).  Una herida quirrgica.  Una fuerza directa e intensa sobre la zona de los riones.  La obstruccin repentina del flujo de [place]. Las causas pueden ser las siguientes:  Hotel manager.  Clculos en el rin.  La prstata agrandada en los hombres. [place] sntomas se desarrollan lentamente y pueden no ser evidentes hasta que el dao renal es grave. Es posible tener una lesin renal aguda durante aos sin presentar sntomas. Los sntomas de esta afeccin pueden incluir lo siguiente:  Hinchazn (edema) en la cara, las piernas, los tobillos o los pies.  Adormecimiento, hormigueo o prdida de la sensibilidad en las manos o en los pies.  Cansancio (letargo).  Nuseas o vmitos.  Confusin o dificultad para concentrarse.  Problemas en la miccin, tales como:  Sensacin de Social research officer, government o ardor al Garment/textile technologist.  Disminucin de la produccin de [place].  Aumento de los deseos de orinar, especialmente por la noche.  Sangre en la  orina.  Contracciones y calambres musculares, especialmente en las piernas.  Falta de aire.  Debilidad.  [employer].  Prdida del apetito.  Gusto metlico en la boca.  Dificultad para dormir.  Palidez en los prpados y la superficie del ojo (conjuntiva). DIAGNSTICO Esta afeccin se puede diagnosticar mediante diversos estudios. Entre ellos:  Anlisis de [place].  Anlisis de [place].  Estudios de diagnstico por imgenes.  Un estudio en el que se toma una muestra de tejido de los riones para Physiological scientist con un microscopio (biopsia renal). TRATAMIENTO El tratamiento de la lesin renal aguda vara segn la causa y la gravedad del dao renal. En los casos leves, puede no ser necesario el [place]. Los riones pueden curarse por s solos. Si la lesin renal aguda es ms grave, el mdico tratar la causa del dao renal, le dar un tratamiento para los riones y tratar de prevenir futuros problemas. Los [employer] graves pueden requerir un procedimiento para [employer] desechos txicos del cuerpo (dilisis) o una ciruga para reparar el dao renal. [place] Corn consiste en lo siguiente:  Reparar un rin daado.  Desobstruir el flujo de [place]. INSTRUCCIONES PARA EL CUIDADO EN EL HOGAR  Siga la dieta que le han indicado.  Tome los medicamentos de venta libre y los recetados solamente como se lo haya indicado el mdico.  No tome ningn medicamento nuevo a menos que lo haya autorizado el mdico. Muchos medicamentos pueden empeorar el dao renal.  No tome ningn suplemento vitamnico o mineral a menos que lo haya autorizado el mdico. Muchos suplementos nutricionales pueden empeorar el dao renal.  Es posible que sea necesario ajustar la  dosis de algunos medicamentos que toma.  No consuma ningn producto que contenga tabaco, lo que incluye cigarrillos, tabaco de Higher education careers adviser y Psychologist, sport and exercise. Si necesita ayuda para dejar de fumar, consulte al mdico.  Concurra a todas las  visitas de control como se lo haya indicado el mdico. Esto es importante.  Lleve un control de la presin arterial. Informe al mdico los cambios en la presin arterial como este se lo haya indicado.  [place] y [place] un peso saludable. Si necesita ayuda para lograrlo, consulte a su mdico.  Comience o contine un plan de ejercicios. Intente hacer ejercicios al menos 37mnutos al da, 5das a la semana.  Est al da con las vacunas como se lo haya indicado el mdico. SOLICITE ATENCIN MDICA SI:  Los sntomas empeoran.  Presenta nuevos sntomas. SOLICITE ATENCIN [place] INMEDIATO SI:  Aparecen sntomas de enfermedad renal en etapa terminal, por ejemplo:  Dolores de cabeza.  La piel se oscurece o se aclara de manera anormal.  Entumecimiento en las manos o en los pies.  Aparecen hematomas con facilidad.  Hipo frecuente.  Dolor en el pecho.  Falta de aire.  Falta de perodo menstrual en las mujeres.  Tiene fiebre.  Disminuye la produccin de o[place]  Siente dolor o tiene una hemorragia al oContinental Airlines PARA OBTENER MS INFORMACIN  Asociacin Americana de Pacientes Renales (American Association of Kidney Patients, AAKP): wBombTimer.gl FNew [place]: www.kidney.org  Fondo Americano para Problemas Renales (AS[place]: whttps://mathis.com/ Programa de rehabilitacin de Life Options: www.lifeoptions.org y www.kidneyschool.org Esta informacin no tiene cMarine scientistel consejo del mdico. Asegrese de hacerle al mdico cualquier pregunta que tenga. Document Released: 12/23/2011 Document Revised: 04/29/2015 Document Reviewed: 09/04/2011 Elsevier Interactive Patient Education  2017 [employer]

## 2016-02-16 NOTE — Discharge Summary (Signed)
Physician Discharge Summary  Hannah Gray ZOX:[id number] DOB: 03-07-1960 DOA: 02/10/2016  PCP: No PCP Per Patient  Admit date: 02/10/2016 Discharge date: 02/16/2016  Recommendations for Outpatient Follow-up:  1. Pt will need to follow up with PCP in 2-3 weeks post discharge 2. Please obtain BMP to evaluate electrolytes and kidney function 3. Please also check CBC to evaluate Hg and Hct levels 4. Pt started on Amaryl, needs close monitor of A1C and readjustment in antihyperglycemic regimen as clinically indicated  5. Pt to complete therapy with doxy and Augmentin as recommended by ENT team   Discharge Diagnoses:  Principal Problem:   Diabetes (HCC)   Abnormal CT scan, neck   Leukocytosis  Discharge Condition: Stable  Diet recommendation: Heart healthy diet discussed in details   Brief Narrative:  Patient admitted for submandibular abscess s/p I&D. Seen by ENT and Id. Was to be discharged but CR increased 3x/normal.    Assessment & Plan:  Complicated left submandibular mass s/p I&D 02/10/16 - ENT and ID input appreciated.  - change to po abx- will do Augmentin 500mg  tid and Doxy 100 mg po bid  - scripts provided and pt made aware she will need close outpatient follow up  - pt is going back to [place] and verbalizes understanding   Incidental lung findings on CT neck - CT chest with contrast- trace b/l pleural fluid, maybe small septic emboli in setting of her neck abscess and enlarged subcarinal node.    Acute Kidney Injury- unknown etiology  - renal U/S normal - Cr improving  - pt will follow up with her PCP in [place]   DM2 - Sliding scale insulin coverage AC/HS for now. - A1c- 14 - diabetic educator recommended Metformin vs Amaryl, pt does not want insulin here as she is from [place] and can not afford medications - since Cr also slightly high, will not give Metformin but rather Amaryl  - pt made aware of need for close outpatient follow up  DVT  prophylaxis:SCDs.  Code Status:FULL Family Communication:pt at bedside  Disposition Plan:home  Consults called:ENT and ID  Consultants:   ENT  ID  Procedures:   I&D 1/22  Antimicrobials:   Augmentin and Doxy   Procedures/Studies: Ct Soft Tissue Neck W Contrast  Result Date: 02/10/2016 CLINICAL DATA:  LEFT facial swelling for 5 days. Evaluate neck abscess. History of diabetes, hypertension. EXAM: CT NECK WITH CONTRAST TECHNIQUE: Multidetector CT imaging of the neck was performed using the standard protocol following the bolus administration of intravenous contrast. CONTRAST:  75mL ISOVUE-300 IOPAMIDOL (ISOVUE-300) INJECTION 61% COMPARISON:  None. FINDINGS: Pharynx and larynx: Trace retropharyngeal effusion, pharynx and larynx are otherwise unremarkable. Salivary glands: Multiloculated 6.2 x 4.6 x 3.4 cm LEFT submandibular irregular cystic peripherally enhancing mass, discontinuous along the lateral margin most consistent with rupture. Hyperemic LEFT submandibular parenchyma without sialolith or definite ductal dilatation. Contiguous extension into the LEFT masseter muscle. Fat stranding anterior aspect LEFT superficial parotid lobe, otherwise unremarkable. Thyroid: Normal. Lymph nodes: Greater than expected number of non pathologically enlarged and may be reactive however, rounded morphology LEFT level 5 B lymph node. Vascular: Trace calcific atherosclerosis of the aortic arch. Limited intracranial: Normal. Visualized orbits: Normal. Mastoids and visualized paranasal sinuses: LEFT maxillary mucosal retention cyst. No paranasal sinus air-fluid levels. Mastoid air cells are well aerated. Skeleton: Mild degenerative changes cervical spine. No acute osseous process. Poor dentition with multiple periapical abscess. Upper chest: Consolidation with central air RIGHT posterior upper lobe, periphery of LEFT upper  lobe. No superior mediastinal lymphadenopathy. Other: Small LEFT neck effusion.  Extensive LEFT neck subcutaneous fat stranding without subcutaneous gas or radiopaque foreign bodies. IMPRESSION: 6.2 x 4.6 x 3.4 cm ruptured LEFT submandibular abscess with LEFT masseter muscle extension. Greater than expected number of non pathologically lymph nodes. The findings may represent simple infection, underlying mass is possible and follow-up is recommended. Dense consolidations and upper lobes concerning for pneumonia, neoplasm not excluded. Recommend CT chest with contrast. Electronically Signed   By: Awilda Metroourtnay  Bloomer M.D.   On: 02/10/2016 18:03   Ct Chest W Contrast  Result Date: 02/11/2016 CLINICAL DATA:  Lung consolidation noted on CT of the neck. Further evaluation requested. Initial encounter. EXAM: CT CHEST WITH CONTRAST TECHNIQUE: Multidetector CT imaging of the chest was performed during intravenous contrast administration. CONTRAST:  75 mL ISOVUE-300 IOPAMIDOL (ISOVUE-300) INJECTION 61% COMPARISON:  CT of the neck performed 02/10/2016 FINDINGS: Cardiovascular: The heart is normal in size. The thoracic aorta is grossly unremarkable. The great vessels are within normal limits. Mediastinum/Nodes: A mildly enlarged 1.1 cm subcarinal node is seen. Remaining visualized mediastinal nodes are normal in size. No pericardial effusion is identified. The visualized portions of the thyroid gland are unremarkable. No axillary lymphadenopathy is seen. Lungs/Pleura: Trace bilateral pleural fluid is noted. A calcified granuloma is noted at the right midlung zone. Mild peripheral wedge-shaped airspace opacities within both upper lobes may reflect small septic emboli, especially given the patient's large neck abscess. Mild bibasilar atelectasis is seen. No dominant mass is identified. Upper Abdomen: The visualized portions of the liver and spleen are unremarkable. The visualized portions of the gallbladder, pancreas, adrenal glands and left kidney are within normal limits, aside from prominent scarring at  the right kidney. Musculoskeletal: No acute osseous abnormalities are identified. The visualized musculature is unremarkable in appearance. IMPRESSION: 1. Trace bilateral pleural fluid, with mild bibasilar atelectasis. Mild peripheral wedge-shaped airspace opacities within both upper lung lobes may reflect small septic emboli, especially given the patient's large neck abscess. No dominant mass seen. 2. Mildly enlarged 1.1 cm subcarinal node may reflect the underlying acute infection. 3. Scarring at the right kidney. Electronically Signed   By: Roanna RaiderJeffery  Chang M.D.   On: 02/11/2016 19:18   [place] Renal  Result Date: 02/14/2016 CLINICAL DATA:  Initial evaluation for acute renal failure. EXAM: RENAL / URINARY TRACT ULTRASOUND COMPLETE COMPARISON:  None. FINDINGS: Right Kidney: Length: 11.2 cm. Echogenicity within normal limits. No mass or hydronephrosis visualized. Left Kidney: Length: 12.7 cm. Echogenicity within normal limits. No mass or hydronephrosis visualized. Bladder: Appears normal for degree of bladder distention. IMPRESSION: Normal renal ultrasound.  No hydronephrosis. Electronically Signed   By: Rise MuBenjamin  McClintock M.D.   On: 02/14/2016 01:14   Dg Chest Portable 1 View  Result Date: 02/10/2016 CLINICAL DATA:  Known cheek abscess EXAM: PORTABLE CHEST 1 VIEW COMPARISON:  None. FINDINGS: Cardiac shadow is at the upper limits of normal in size but accentuated by the portable technique. The lungs are well aerated bilaterally. No focal infiltrate or sizable effusion is seen. No acute bony abnormality is noted. IMPRESSION: No acute abnormality seen. Electronically Signed   By: Alcide CleverMark  Lukens M.D.   On: 02/10/2016 20:34    Discharge Exam: Vitals:   02/16/16 0447 02/16/16 1000  BP: 116/77 100/74  Pulse: 79 77  Resp: 18 18  Temp: 100 F (37.8 C) 99.3 F (37.4 C)   Vitals:   02/15/16 1738 02/15/16 2117 02/16/16 0447 02/16/16 1000  BP: 121/73 130/71 116/77 100/74  Pulse: 66 (!) 58 79 77  Resp: 18 18  18 18   Temp: 98.2 F (36.8 C) 99.4 F (37.4 C) 100 F (37.8 C) 99.3 F (37.4 C)  TempSrc: Oral Oral Oral Oral  SpO2: 98% 97% 95% 94%  Weight:  78.2 kg (172 lb 6.4 oz)      General: Pt is alert, follows commands appropriately, not in acute distress Cardiovascular: Regular rate and rhythm, S1/S2 +, no murmurs, no rubs, no gallops Respiratory: Clear to auscultation bilaterally, no wheezing, no crackles, no rhonchi Abdominal: Soft, non tender, non distended, bowel sounds +, no guarding  Discharge Instructions  Discharge Instructions    Diet - low sodium heart healthy    Complete by:  As directed    Increase activity slowly    Complete by:  As directed      Allergies as of 02/16/2016   No Known Allergies     Medication List    TAKE these medications   amoxicillin-clavulanate 500-125 MG tablet Commonly known as:  AUGMENTIN Take 1 tablet (500 mg total) by mouth 3 (three) times daily.   doxycycline 100 MG tablet Commonly known as:  VIBRA-TABS Take 1 tablet (100 mg total) by mouth every 12 (twelve) hours.   glimepiride 2 MG tablet Commonly known as:  AMARYL Take 1 tablet (2 mg total) by mouth daily with breakfast.   OVER THE COUNTER MEDICATION Take 1 tablet by mouth daily.   OVER THE COUNTER MEDICATION Take 1 tablet by mouth daily.      Follow-up Information    Darletta Moll, MD Follow up.   Specialty:  Otolaryngology Why:  As Needed Contact information: [address] [place] [address] [phone]        Dr Staci Righter Follow up.   Why:  Dr Luciana Axe to make an appointment in his clinic for you to follow up.            The results of significant diagnostics from this hospitalization (including imaging, microbiology, ancillary and laboratory) are listed below for reference.     Microbiology: Recent Results (from the past 240 hour(s))  Blood culture (routine x 2)     Status: None   Collection Time: 02/10/16  7:53 PM  Result Value Ref Range  Status   Specimen Description BLOOD RIGHT HAND  Final   Special Requests BOTTLES DRAWN AEROBIC AND ANAEROBIC 3CC  Final   Culture NO GROWTH 5 DAYS  Final   Report Status 02/15/2016 FINAL  Final  Blood culture (routine x 2)     Status: None   Collection Time: 02/10/16  8:36 PM  Result Value Ref Range Status   Specimen Description BLOOD RIGHT HAND  Final   Special Requests BOTTLES DRAWN AEROBIC AND ANAEROBIC 5CC  Final   Culture NO GROWTH 5 DAYS  Final   Report Status 02/15/2016 FINAL  Final  Aerobic/Anaerobic Culture (surgical/deep wound)     Status: None (Preliminary result)   Collection Time: 02/11/16  8:40 AM  Result Value Ref Range Status   Specimen Description ABSCESS LEFT FACE  Final   Special Requests NONE  Final   Gram Stain   Final    MODERATE WBC PRESENT, PREDOMINANTLY PMN ABUNDANT GRAM POSITIVE COCCI IN PAIRS ABUNDANT GRAM NEGATIVE COCCOBACILLI FEW GRAM VARIABLE ROD    Culture HOLDING FOR POSSIBLE ANAEROBE  Final   Report Status PENDING  Incomplete     Labs: Basic Metabolic Panel:  Recent Labs Lab 02/13/16 0454 02/13/16 1448  02/14/16 0544 02/15/16 0528 02/16/16 0515  NA 141 141 142 142 141  K 4.1 3.7 4.2 4.2 4.5  CL 106 106 107 109 103  CO2 28 29 28 26 30   GLUCOSE 186* 149* 132* 165* 173*  BUN 13 13 12 14 16   CREATININE 1.81* 1.76* 1.64* 1.44* 1.45*  CALCIUM 8.9 8.7* 9.0 9.0 9.8   Liver Function Tests:  Recent Labs Lab 02/10/16 1141  AST 25  ALT 22  ALKPHOS 123  BILITOT 0.7  PROT 7.7  ALBUMIN 3.2*   CBC:  Recent Labs Lab 02/10/16 1141 02/11/16 0315 02/16/16 0515  WBC 13.7* 10.7* 10.2  NEUTROABS 10.2*  --   --   HGB 14.8 13.2 13.8  HCT 43.5 39.4 42.4  MCV 86.7 86.4 88.3  PLT 287 250 263    CBG:  Recent Labs Lab 02/15/16 0833 02/15/16 1148 02/15/16 1610 02/15/16 2247 02/16/16 0736  GLUCAP 167* 195* 224* 111* 178*     SIGNED: Time coordinating discharge:  30 minutes  MAGICK-MYERS, ISKRA, MD  [facility] 02/16/2016, 11:09 AM Pager [phone]  If 7PM-7AM, please contact night-coverage [email or web address] Password [email or web address]

## 2016-02-16 NOTE — Progress Notes (Signed)
Reviewed discharge instructions and medications with patient using approved translator via telephone; all questions answered.  IV removed and hemostasis achieved. Assessment is as charted. Patient will be leaving floor when family arrives.

## 2016-02-16 NOTE — Progress Notes (Signed)
Patient leaving unit in stable condition.

## 2016-02-17 LAB — AEROBIC/ANAEROBIC CULTURE (SURGICAL/DEEP WOUND)

## 2016-02-17 LAB — AEROBIC/ANAEROBIC CULTURE W GRAM STAIN (SURGICAL/DEEP WOUND)

## 2016-02-25 ENCOUNTER — Ambulatory Visit: Payer: Self-pay | Admitting: Internal Medicine

## 2017-02-03 IMAGING — US US RENAL
1 series · 14 of 22 positions shown · non-contrast
Comparison: None.

CLINICAL DATA: Initial evaluation for acute renal failure.

EXAM:
RENAL / URINARY TRACT ULTRASOUND COMPLETE

[Series 1: us renal · 0.26mm/px · 14 of 22 slices shown]
[im 1/22]
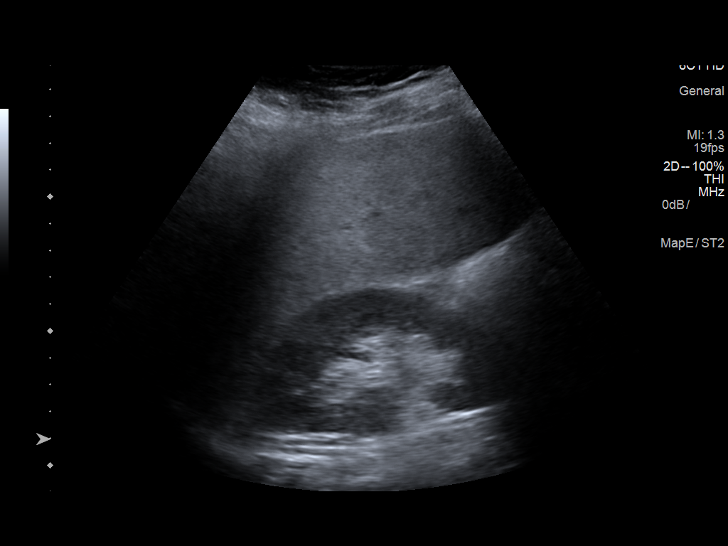
[im 3/22]
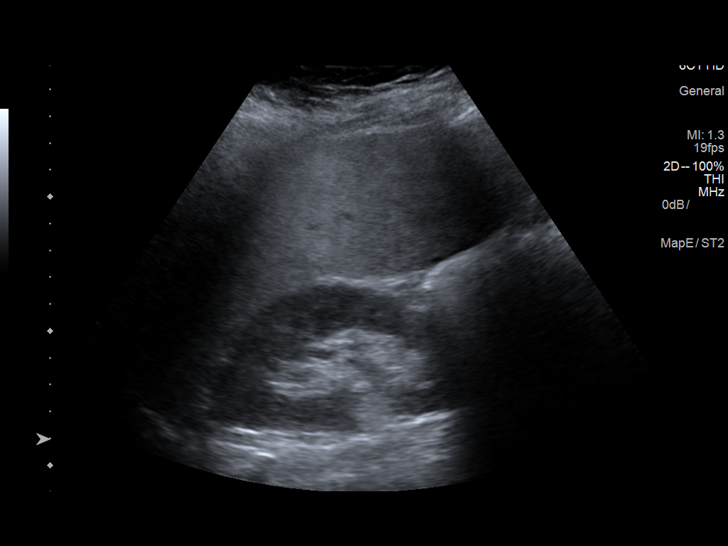
[im 4/22]
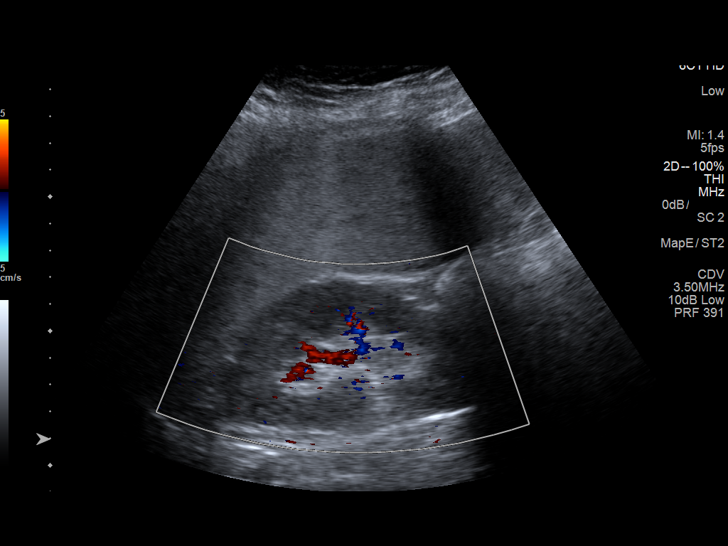
[im 6/22]
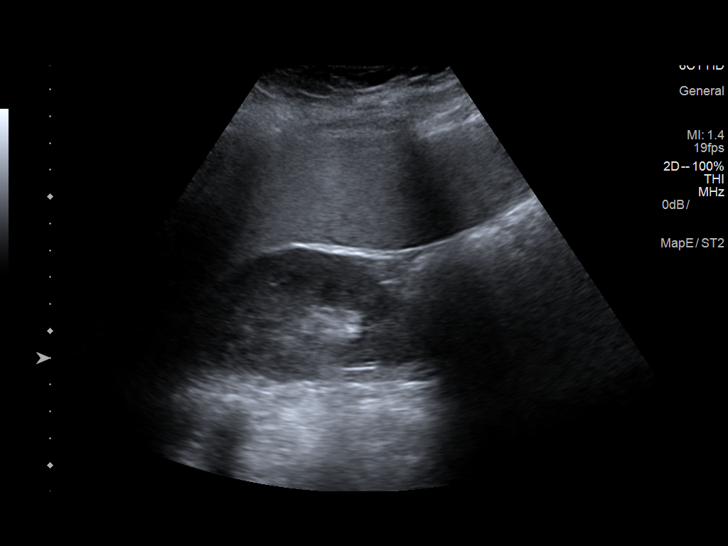
[im 8/22]
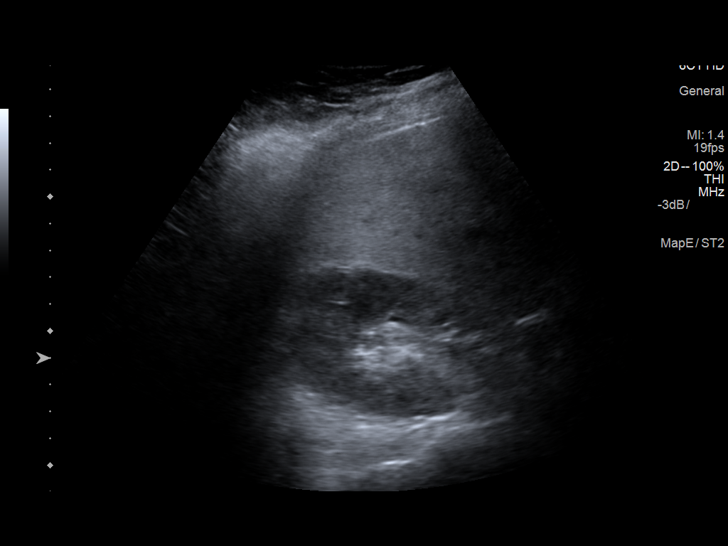
[im 9/22]
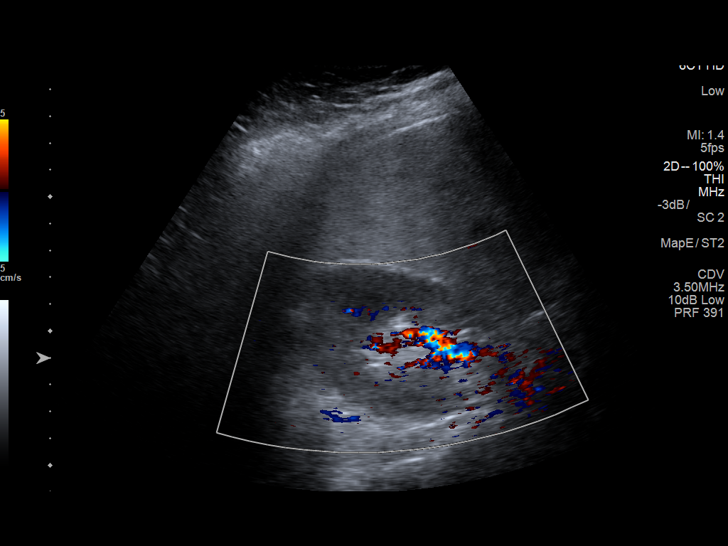
[im 11/22]
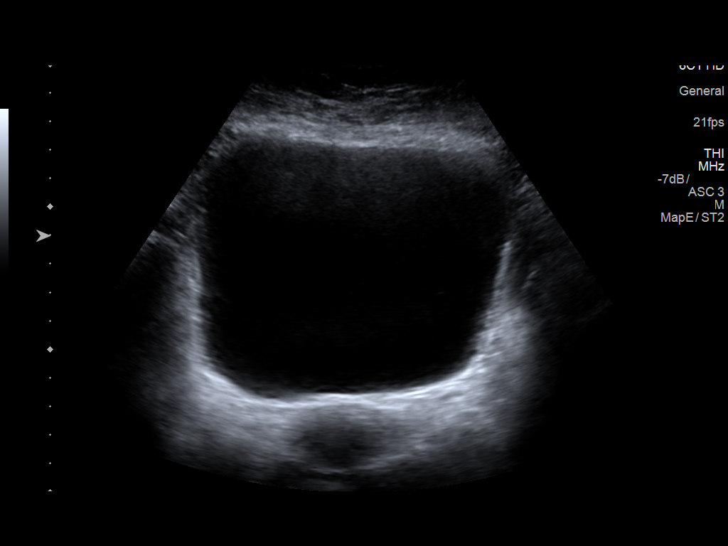
[im 12/22]
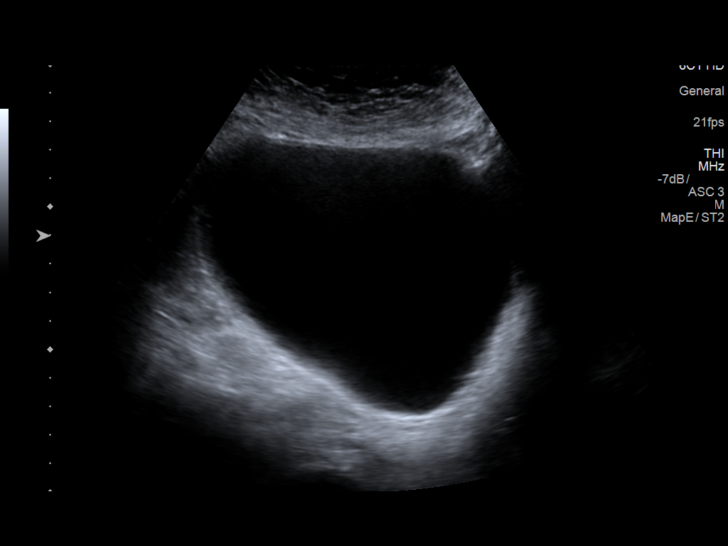
[im 14/22]
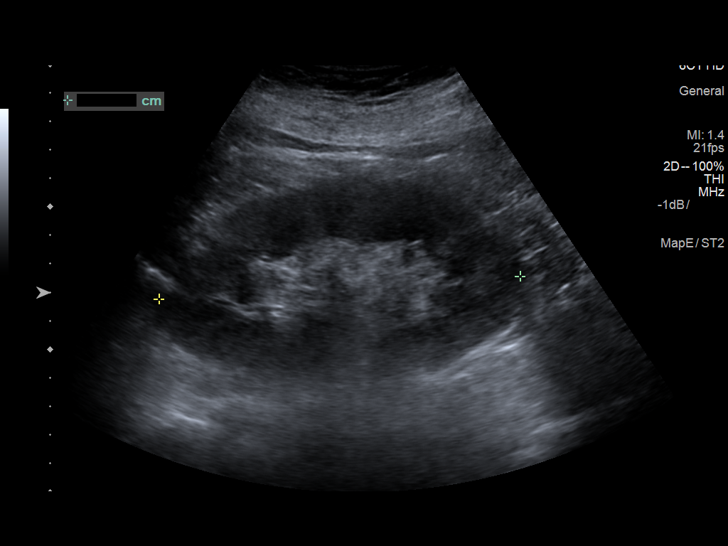
[im 15/22]
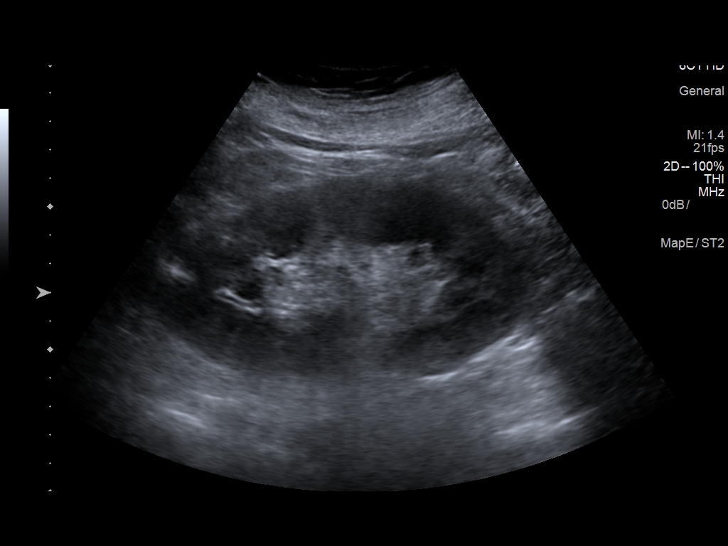
[im 17/22]
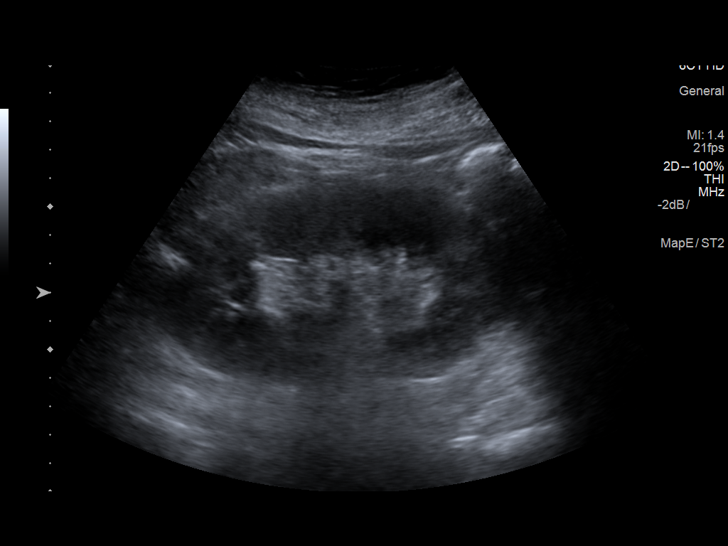
[im 19/22]
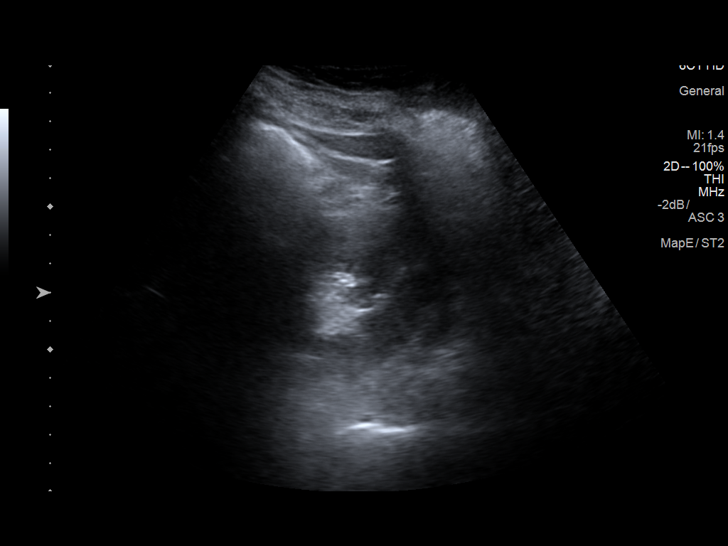
[im 20/22]
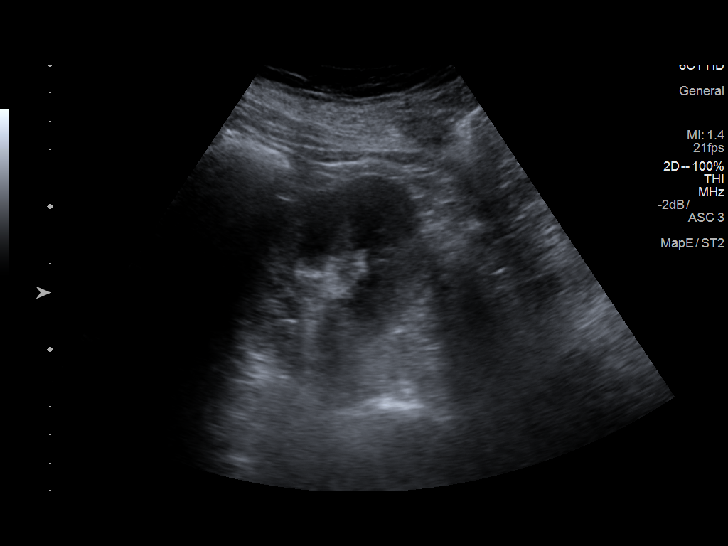
[im 22/22]
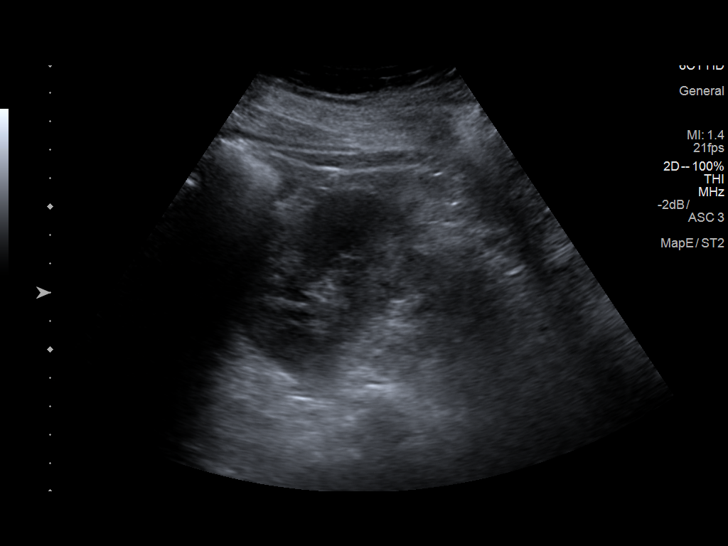

[14 of 22 positions shown; findings below may reference images not displayed]

FINDINGS: Right Kidney:

Length: 11.2 cm. Echogenicity within normal limits. No mass or
hydronephrosis visualized.

Left Kidney:

Length: 12.7 cm. Echogenicity within normal limits. No mass or
hydronephrosis visualized.

Bladder:

Appears normal for degree of bladder distention.
IMPRESSION: Normal renal ultrasound.  No hydronephrosis.

## 2018-02-24 IMAGING — CT CT NECK W/ CM
4 series · 14 of 33 positions shown, 17 images · IV contrast (Omni 300)
Comparison: None.

CLINICAL DATA: LEFT facial swelling for 5 days. Evaluate neck
abscess. History of diabetes, hypertension.

EXAM:
CT NECK WITH CONTRAST
TECHNIQUE: Multidetector CT imaging of the neck was performed using the
standard protocol following the bolus administration of intravenous
contrast.
CONTRAST:  75mL OLDYKN-HNN IOPAMIDOL (OLDYKN-HNN) INJECTION 61%

[Series 2: neck 2.0 st · axial · 0.48mm/px · z∈[-250,-104]mm · 5 of 111 slices shown, 7 images (1 of 3)]
[im 19/111  soft-tissue]
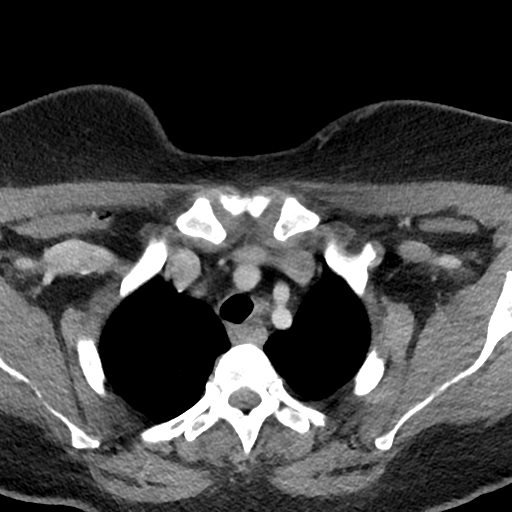
[im 19/111  bone]
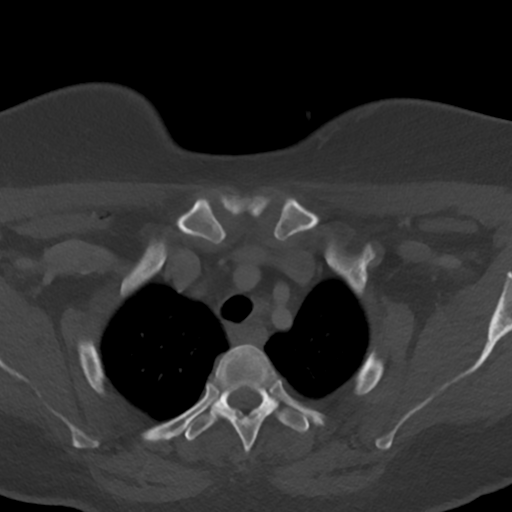
[im 37/111  bone]
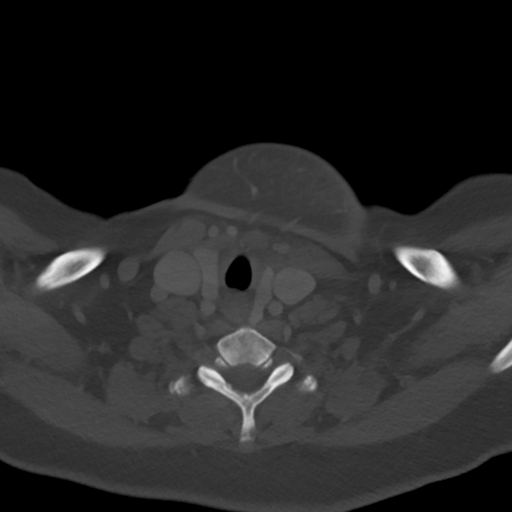
[im 56/111  bone]
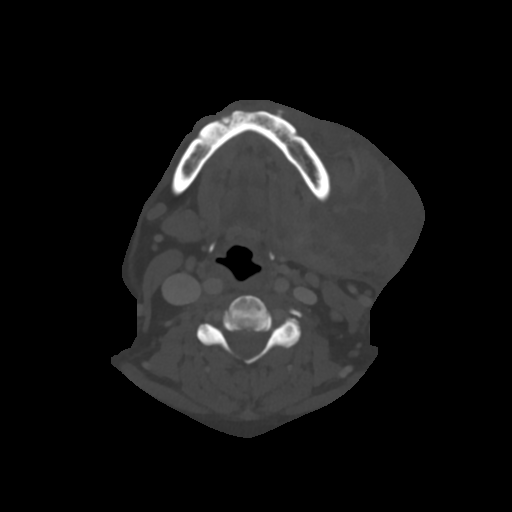
[im 74/111  bone]
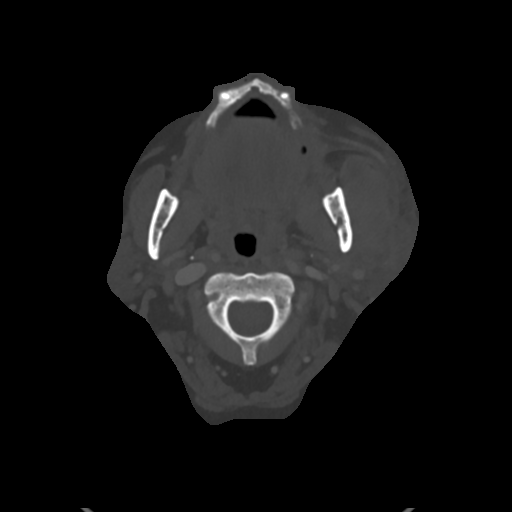
[im 92/111  soft-tissue]
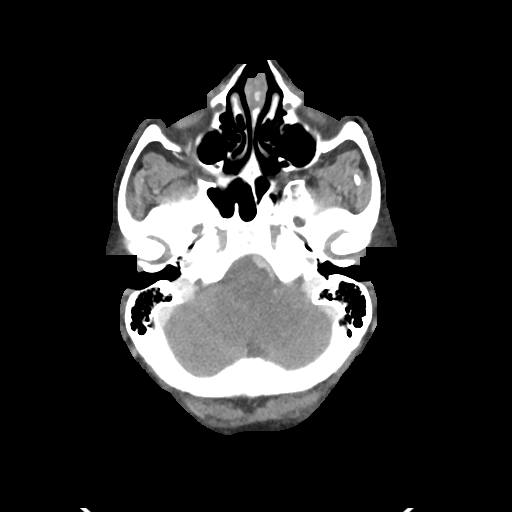
[im 92/111  bone]
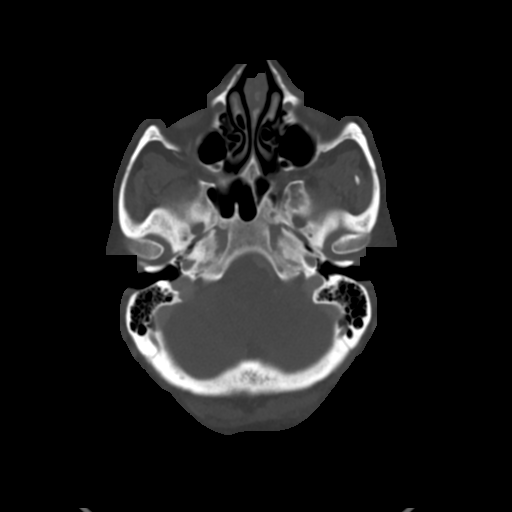

[Series 4: neck 2.0 st · sagittal · 0.50mm/px · 5 of 101 slices shown, 6 images (2 of 3)]
[im 34/101  bone]
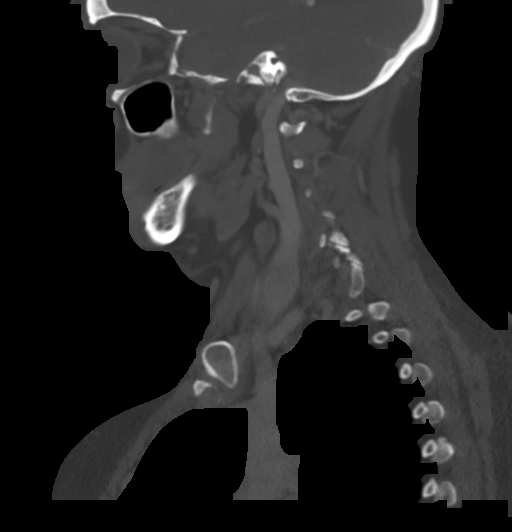
[im 42/101  bone]
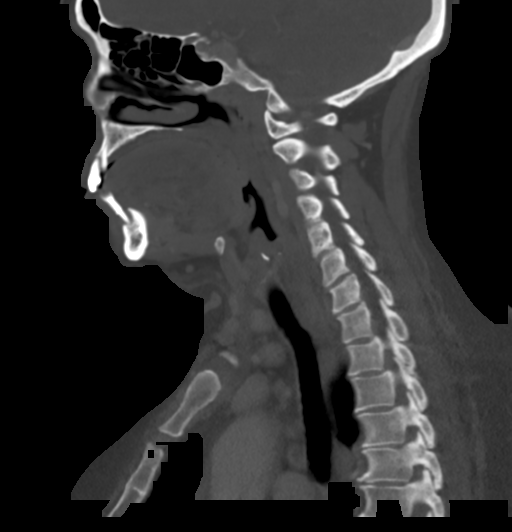
[im 51/101  soft-tissue]
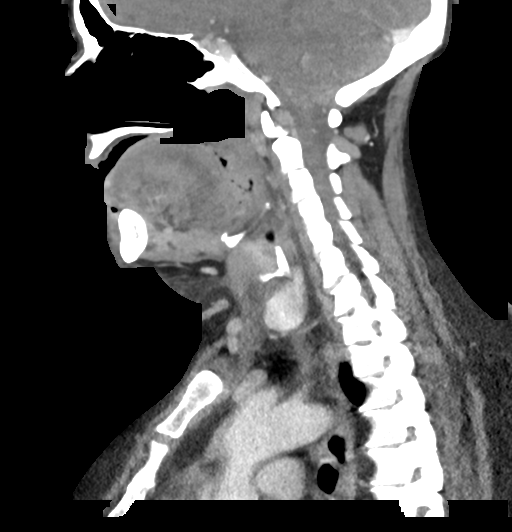
[im 51/101  bone]
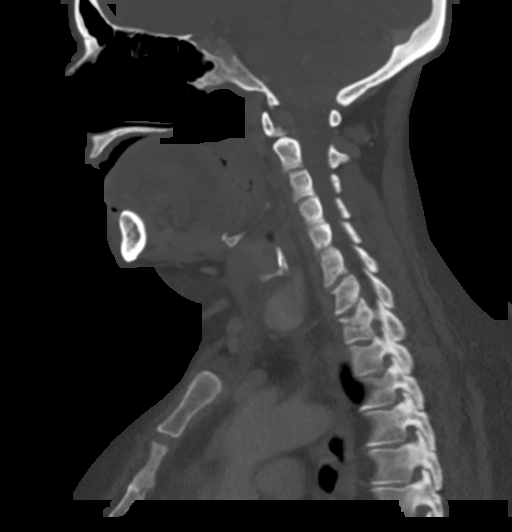
[im 59/101  bone]
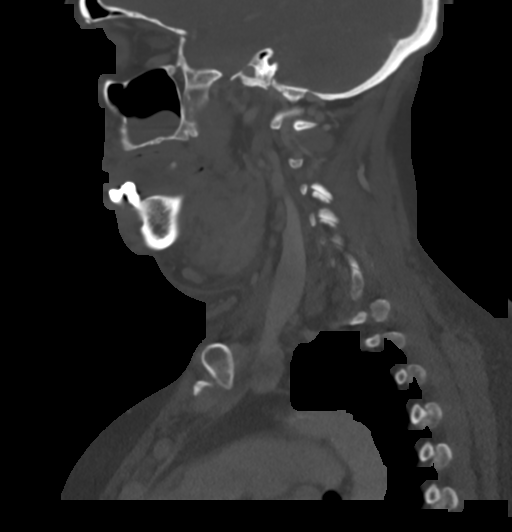
[im 67/101  bone]
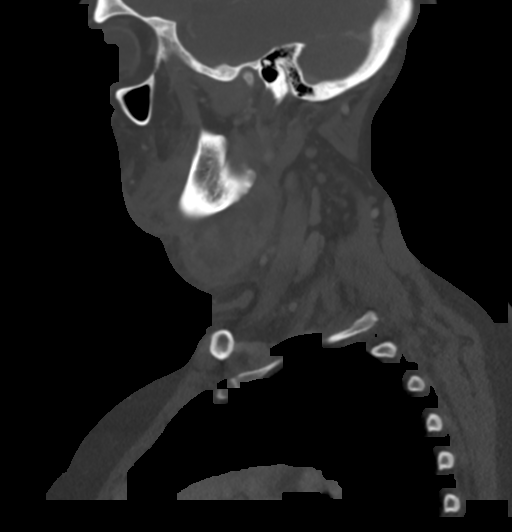

[Series 5: neck 2.0 st · coronal · 0.43mm/px · 3 of 119 slices shown (3 of 3)]
[im 24/119  bone]
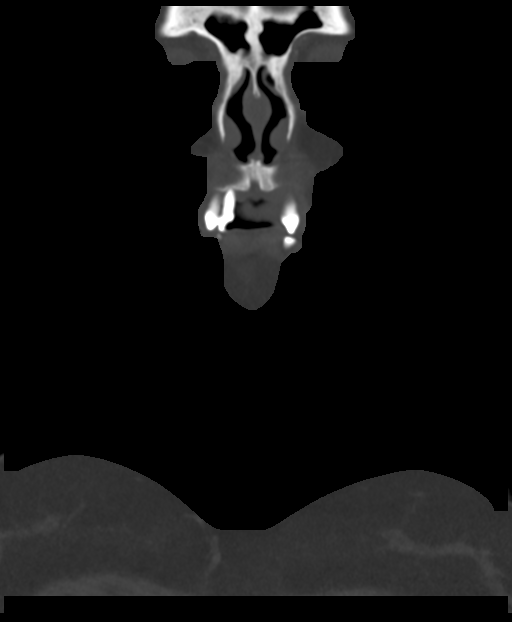
[im 48/119  bone]
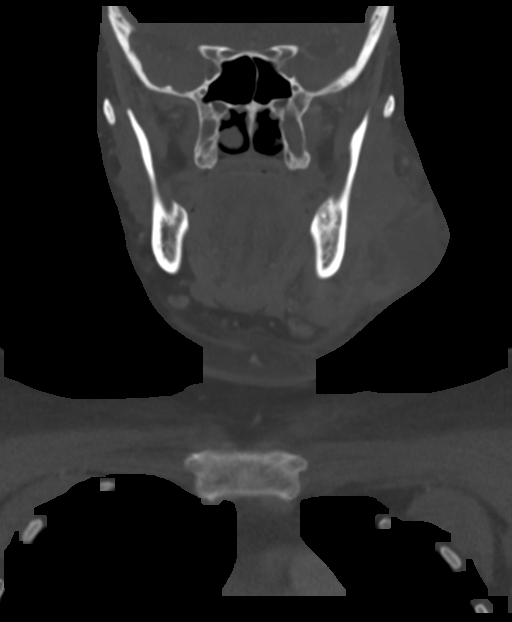
[im 71/119  bone]
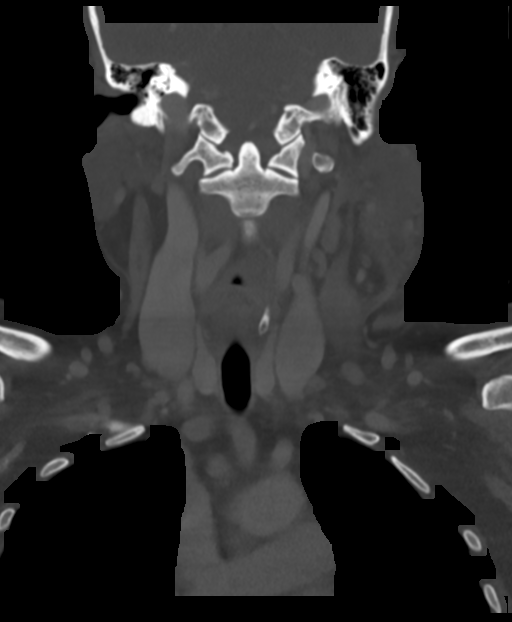

[Series 6: neck 2.0 st orthogonal · axial · 0.43mm/px · 1 of 128 slices shown]
[im 22/128  bone]
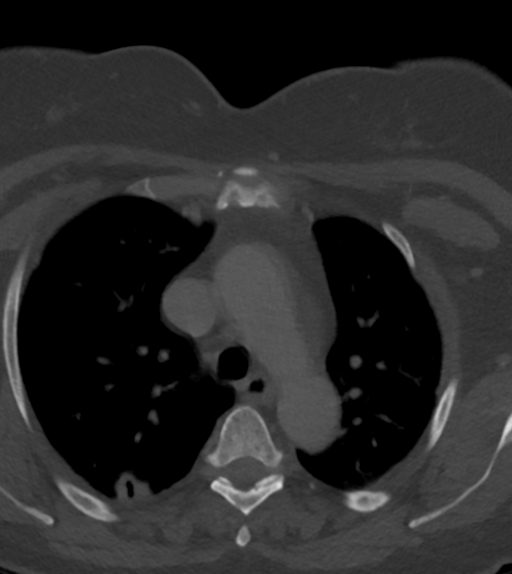

[14 of 33 positions shown; findings below may reference images not displayed]

FINDINGS: Pharynx and larynx: Trace retropharyngeal effusion, pharynx and
larynx are otherwise unremarkable.

Salivary glands: Multiloculated 6.2 x 4.6 x 3.4 cm LEFT
submandibular irregular cystic peripherally enhancing mass,
discontinuous along the lateral margin most consistent with rupture.
Hyperemic LEFT submandibular parenchyma without sialolith or
definite ductal dilatation. Contiguous extension into the LEFT
masseter muscle. Fat stranding anterior aspect LEFT superficial
parotid lobe, otherwise unremarkable.

Thyroid: Normal.

Lymph nodes: Greater than expected number of non pathologically
enlarged and may be reactive however, rounded morphology LEFT level
5 B lymph node.

Vascular: Trace calcific atherosclerosis of the aortic arch.

Limited intracranial: Normal.

Visualized orbits: Normal.

Mastoids and visualized paranasal sinuses: LEFT maxillary mucosal
retention cyst. No paranasal sinus air-fluid levels. Mastoid air
cells are well aerated.

Skeleton: Mild degenerative changes cervical spine. No acute osseous
process. Poor dentition with multiple periapical abscess.

Upper chest: Consolidation with central air RIGHT posterior upper
lobe, periphery of LEFT upper lobe. No superior mediastinal
lymphadenopathy.

Other: Small LEFT neck effusion. Extensive LEFT neck subcutaneous
fat stranding without subcutaneous gas or radiopaque foreign bodies.
IMPRESSION: 6.2 x 4.6 x 3.4 cm ruptured LEFT submandibular abscess with LEFT
masseter muscle extension. Greater than expected number of non
pathologically lymph nodes. The findings may represent simple
infection, underlying mass is possible and follow-up is recommended.

Dense consolidations and upper lobes concerning for pneumonia,
neoplasm not excluded. Recommend CT chest with contrast.
